# Patient Record
Sex: Female | Born: 1935
Health system: Southern US, Community
[De-identification: ages and names within clinical notes are randomized; demographics above are authoritative.]

## PROBLEM LIST (undated history)

## (undated) DIAGNOSIS — M5430 Sciatica, unspecified side: Secondary | ICD-10-CM

## (undated) DIAGNOSIS — S8010XA Contusion of unspecified lower leg, initial encounter: Secondary | ICD-10-CM

## (undated) HISTORY — DX: Sciatica, unspecified side: M54.30

## (undated) HISTORY — DX: Contusion of unspecified lower leg, initial encounter: S80.10XA

## (undated) HISTORY — PX: VEIN SURGERY: SHX48

---

## 1997-08-24 ENCOUNTER — Ambulatory Visit (HOSPITAL_COMMUNITY): Admission: RE | Admit: 1997-08-24 | Discharge: 1997-08-24 | Payer: Self-pay | Admitting: Obstetrics and Gynecology

## 1998-03-21 ENCOUNTER — Ambulatory Visit (HOSPITAL_COMMUNITY): Admission: RE | Admit: 1998-03-21 | Discharge: 1998-03-21 | Payer: Self-pay | Admitting: Obstetrics & Gynecology

## 1998-03-28 ENCOUNTER — Other Ambulatory Visit: Admission: RE | Admit: 1998-03-28 | Discharge: 1998-03-28 | Payer: Self-pay | Admitting: Obstetrics and Gynecology

## 1999-07-19 ENCOUNTER — Other Ambulatory Visit: Admission: RE | Admit: 1999-07-19 | Discharge: 1999-07-19 | Payer: Self-pay | Admitting: Obstetrics and Gynecology

## 2000-09-11 ENCOUNTER — Other Ambulatory Visit: Admission: RE | Admit: 2000-09-11 | Discharge: 2000-09-11 | Payer: Self-pay | Admitting: Obstetrics and Gynecology

## 2005-09-11 ENCOUNTER — Encounter: Payer: Self-pay | Admitting: Family Medicine

## 2007-07-10 LAB — HM MAMMOGRAPHY: HM Mammogram: NORMAL

## 2007-07-10 LAB — HM COLONOSCOPY: HM Colonoscopy: NORMAL

## 2008-03-11 ENCOUNTER — Encounter: Payer: Self-pay | Admitting: Family Medicine

## 2008-03-23 ENCOUNTER — Encounter: Payer: Self-pay | Admitting: Family Medicine

## 2008-03-23 ENCOUNTER — Encounter: Admission: RE | Admit: 2008-03-23 | Discharge: 2008-03-23 | Payer: Self-pay | Admitting: Family Medicine

## 2008-09-06 ENCOUNTER — Ambulatory Visit: Payer: Self-pay | Admitting: Family Medicine

## 2008-09-06 DIAGNOSIS — R05 Cough: Secondary | ICD-10-CM | POA: Insufficient documentation

## 2008-09-06 DIAGNOSIS — R059 Cough, unspecified: Secondary | ICD-10-CM | POA: Insufficient documentation

## 2008-09-15 ENCOUNTER — Telehealth: Payer: Self-pay | Admitting: Family Medicine

## 2008-09-16 ENCOUNTER — Ambulatory Visit: Payer: Self-pay | Admitting: Family Medicine

## 2008-10-05 ENCOUNTER — Ambulatory Visit: Payer: Self-pay | Admitting: Family Medicine

## 2008-10-05 DIAGNOSIS — M5430 Sciatica, unspecified side: Secondary | ICD-10-CM | POA: Insufficient documentation

## 2008-10-05 HISTORY — DX: Sciatica, unspecified side: M54.30

## 2009-01-07 ENCOUNTER — Telehealth (INDEPENDENT_AMBULATORY_CARE_PROVIDER_SITE_OTHER): Payer: Self-pay | Admitting: *Deleted

## 2009-12-24 IMAGING — CR DG CHEST 2V
2 series · 2 of 2 positions shown · non-contrast
Comparison: 09/06/2008

CLINICAL DATA: Follow-up pneumonia/still some residual chest pain

CHEST - 2 VIEW

[view not recorded (1 of 2)]
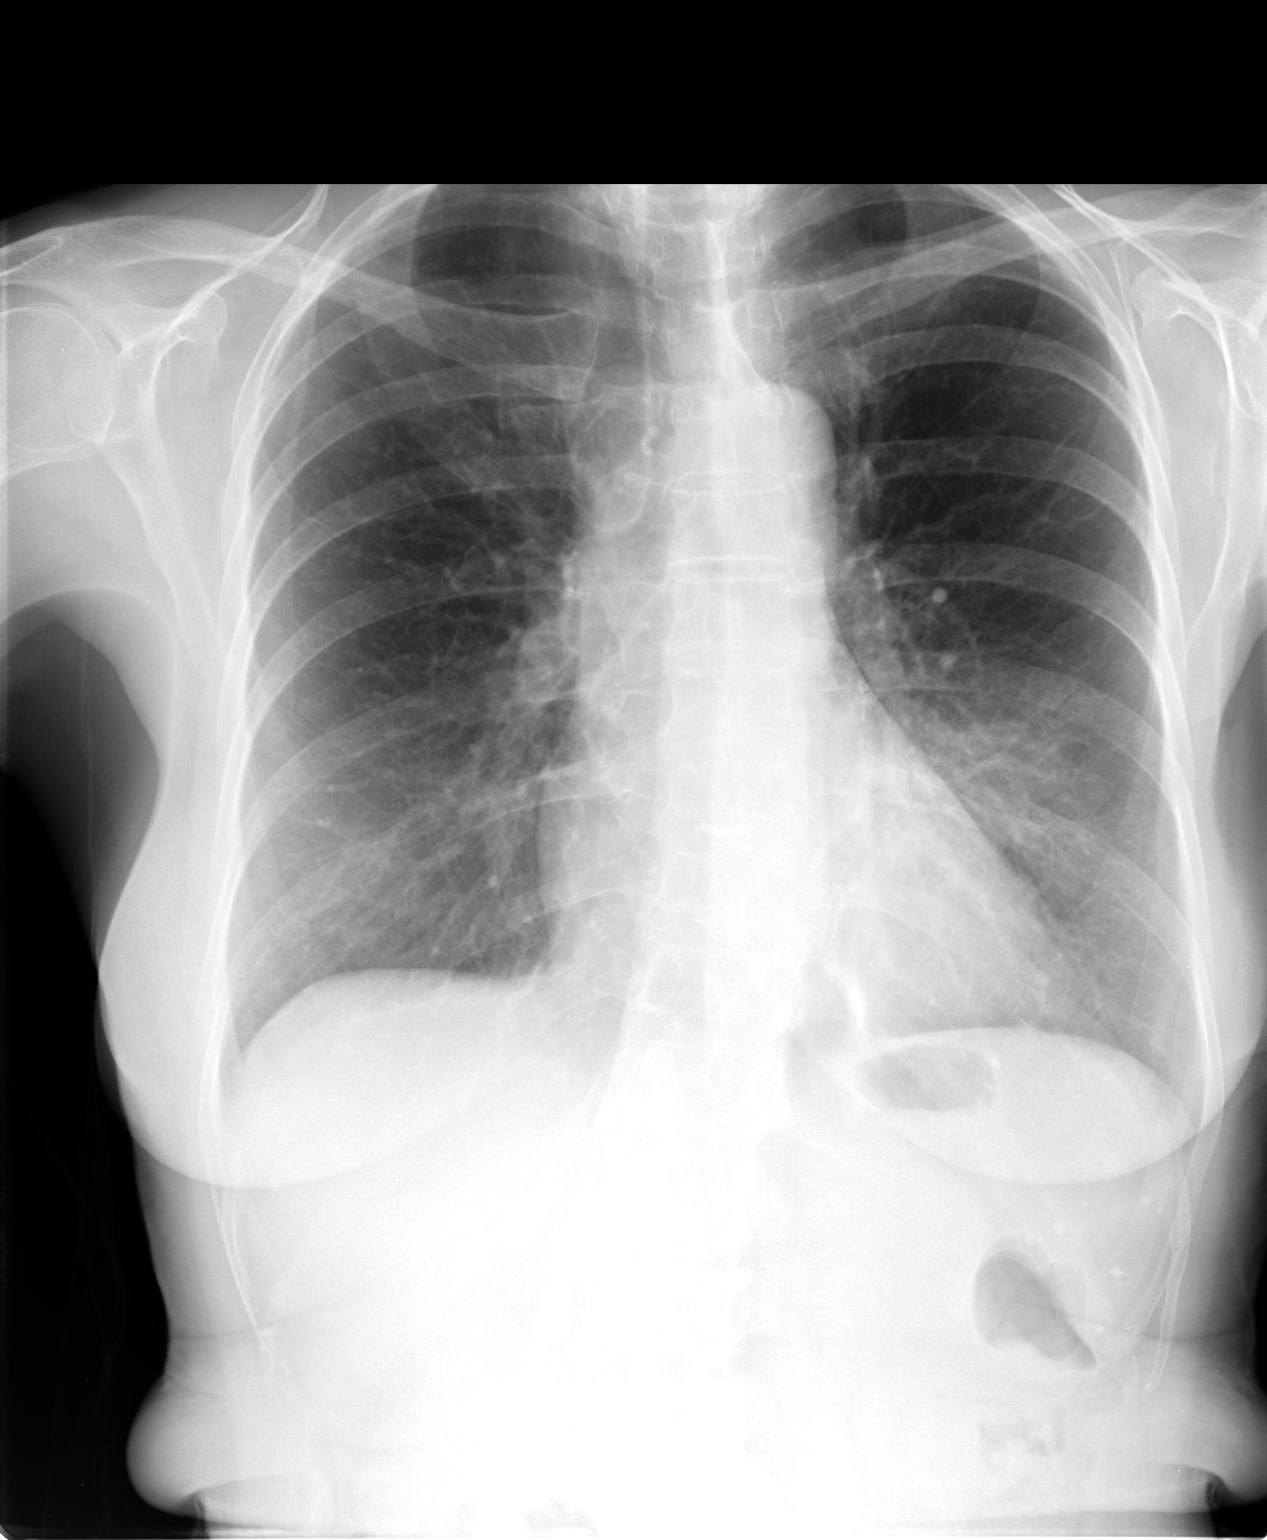

[view not recorded (2 of 2)]
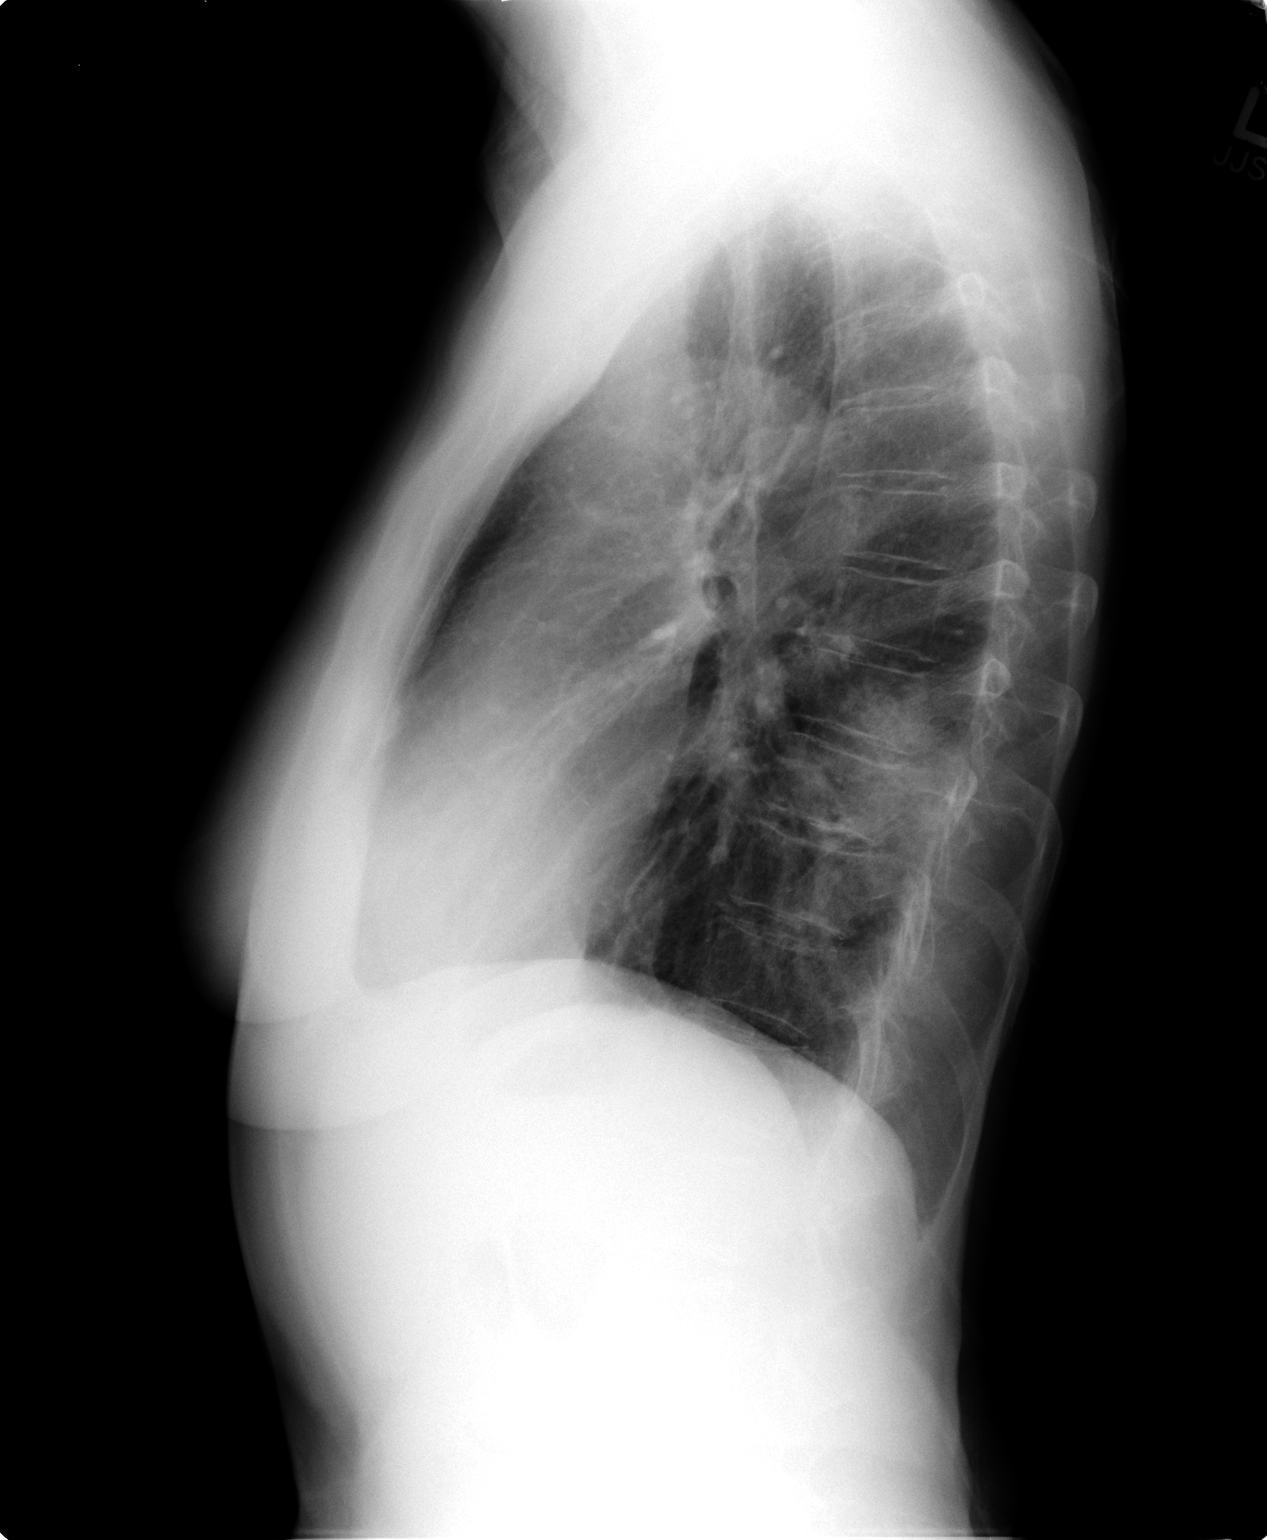

[2 of 2 positions shown; findings below may reference images not displayed]

FINDINGS: Significant interval improvement in the superior segment
left lower lobe airspace density.  The findings are consistent with
resolving pneumonia.  Continued follow-up to complete resolution is
recommended.

Lungs otherwise clear.  No pleural fluid.
IMPRESSION: Significant interval improvement in left lower lobe pneumonia - see
report.  No new findings.

## 2010-01-30 ENCOUNTER — Ambulatory Visit: Payer: Self-pay | Admitting: Family Medicine

## 2010-01-30 DIAGNOSIS — S8010XA Contusion of unspecified lower leg, initial encounter: Secondary | ICD-10-CM

## 2010-01-30 DIAGNOSIS — M5412 Radiculopathy, cervical region: Secondary | ICD-10-CM | POA: Insufficient documentation

## 2010-01-30 HISTORY — DX: Contusion of unspecified lower leg, initial encounter: S80.10XA

## 2010-06-26 ENCOUNTER — Ambulatory Visit
Admission: RE | Admit: 2010-06-26 | Discharge: 2010-06-26 | Payer: Self-pay | Source: Home / Self Care | Attending: Family Medicine | Admitting: Family Medicine

## 2010-06-29 ENCOUNTER — Ambulatory Visit
Admission: RE | Admit: 2010-06-29 | Discharge: 2010-06-29 | Payer: Self-pay | Source: Home / Self Care | Attending: Family Medicine | Admitting: Family Medicine

## 2010-07-20 NOTE — Assessment & Plan Note (Signed)
Summary: emp/pt coming fasting/cjr   Vital Signs:  Patient profile:   77 year old female Menstrual status:  postmenopausal Height:      64.25 inches Weight:      120 pounds BMI:     20.51 Temp:     98.0 degrees F oral Pulse rate:   72 / minute Pulse rhythm:   regular Resp:     12 per minute BP sitting:   130 / 72  (left arm) Cuff size:   regular  Vitals Entered By: Sid Falcon LPN (June 26, 2010 9:23 AM)  History of Present Illness: Here for wellness exam.  Here for Medicare AWV:  1.   Risk factors based on Past M, S, F history has no chronic med problems.  retired Engineer, civil (consulting). 2.   Physical Activities: Exercising regularly with walking 3.   Depression/mood: no depression or anxiety issues. 4.   Hearing: no problems 5.   ADL's: Fully independent 6.   Fall Risk: very low.  Walks regularly. 7.   Home Safety: no concerns identified 8.   Height, weight, &visual acuity:  vision check per optometrist once yearly. wt and height stable. 9.   Counseling: Cont with exercise, Ca and Vit d supplement. 10.   Labs ordered based on risk factors: discussed with pt and non indicated. 11.           Referral Coordination  none indicated at this time. 12.           Care Plan  Flu vaccine, PVX, mammogram Discussed DEXA and pt not interested. 13.            Cognitive Assessment  no defecits in short or long term memory.  No impairment in  judgement.   Clinical Review Panels:  Prevention   Last Mammogram:  normal (06/19/2007)   Last Colonoscopy:  normal (06/19/2007)  Immunizations   Last Tetanus Booster:  Td (06/18/2002)   Last Pneumovax:  Pneumovax (06/26/2010)   Allergies: 1)  ! Sulfadiazine (Sulfadiazine)  Past History:  Past Medical History: Last updated: 09/06/2008 no significant chronic illness.  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[employer]) Married Never Smoked  Risk Factors: Smoking Status: never (09/06/2008) PMH-FH-SH reviewed for relevance  Review of Systems  The patient denies anorexia, fever, weight loss, weight gain, vision loss, decreased hearing, hoarseness, chest pain, syncope, dyspnea on exertion, peripheral edema, prolonged cough, headaches, hemoptysis, abdominal pain, melena, hematochezia, severe indigestion/heartburn, hematuria, incontinence, genital sores, muscle weakness, suspicious skin lesions, transient blindness, difficulty walking, depression, unusual weight change, abnormal bleeding, enlarged lymph nodes, and breast masses.    Physical Exam  General:  Well-developed,well-nourished,in no acute distress; alert,appropriate and cooperative throughout examination Head:  Normocephalic and atraumatic without obvious abnormalities. No apparent alopecia or balding. Eyes:  pupils equal, pupils round, and pupils reactive to light.   Ears:  External ear exam shows no significant lesions or deformities.  Otoscopic examination reveals clear canals, tympanic membranes are intact bilaterally without bulging, retraction, inflammation or discharge. Hearing is grossly normal bilaterally. Mouth:  Oral mucosa and oropharynx without lesions or exudates.  Teeth in good repair. Neck:  No deformities, masses, or tenderness noted. Breasts:  No mass, nodules, thickening, tenderness, bulging, retraction, inflamation, nipple discharge or skin changes noted.   Lungs:  Normal respiratory effort, chest expands  symmetrically. Lungs are clear to auscultation, no crackles or wheezes. Heart:  normal rate and regular rhythm.   Abdomen:  Bowel sounds positive,abdomen soft and non-tender without masses, organomegaly or hernias noted. Extremities:  No clubbing, cyanosis, edema, or deformity noted with normal full range of motion of all joints.    Neurologic:  alert & oriented X3 and cranial nerves II-XII intact.   Skin:  multiple seb keratoses.  No concerning lesions. Cervical Nodes:  No lymphadenopathy noted Psych:  normally interactive, good eye contact, not anxious appearing, and not depressed appearing.     Impression & Recommendations:  Problem # 1:  Preventive Health Care (ICD-V70.0) PVX given.  Rec yearly mammogram.  She has decided against further paps at her age. Flu vaccine needed but we are out.  discussed Zostavax but she is not interested.  Other Orders: Pneumococcal Vaccine (27253) Admin 1st Vaccine (66440) Medicare -1st Annual Wellness Visit [id number])   Orders Added: 1)  Pneumococcal Vaccine [90732] 2)  Admin 1st Vaccine [90471] 3)  Medicare -1st Annual Wellness Visit [G0438]   Immunizations Administered:  Pneumonia Vaccine:    Vaccine Type: Pneumovax    Site: left deltoid    Mfr: Merck    Dose: 0.5 ml    Route: IM    Given by: Sid Falcon LPN    Exp. Date: 10/17/2011    Lot #: [id number]   Immunizations Administered:  Pneumonia Vaccine:    Vaccine Type: Pneumovax    Site: left deltoid    Mfr: Merck    Dose: 0.5 ml    Route: IM    Given by: Sid Falcon LPN    Exp. Date: 10/17/2011    Lot #: [id number]

## 2010-07-20 NOTE — Assessment & Plan Note (Signed)
Summary: flu shot/nn  Nurse Visit   Allergies: 1)  ! Sulfadiazine (Sulfadiazine)  Immunizations Administered:  Influenza Vaccine # 1:    Vaccine Type: Fluvax 3+    Site: left deltoid    Mfr: GlaxoSmithKline    Dose: 0.5 ml    Route: IM    Given by: Sid Falcon LPN    Exp. Date: 11/17/2010    Lot #: [id number]  Flu Vaccine Consent Questions:    Do you have a history of severe allergic reactions to this vaccine? no    Any prior history of allergic reactions to egg and/or gelatin? no    Do you have a sensitivity to the preservative Thimersol? no    Do you have a past history of Guillan-Barre Syndrome? no    Do you currently have an acute febrile illness? no    Have you ever had a severe reaction to latex? no    Vaccine information given and explained to patient? yes    Are you currently pregnant? no  Orders Added: 1)  Flu Vaccine 70yrs + [90658] 2)  Admin 1st Vaccine [10272]

## 2010-07-20 NOTE — Assessment & Plan Note (Signed)
Summary: painful arm/dm   Vital Signs:  Patient profile:   77 year old female Temp:     98.0 degrees F oral BP sitting:   110 / 74  (left arm) Cuff size:   regular  Vitals Entered By: Sid Falcon LPN (January 30, 2010 4:00 PM)  History of Present Illness: [place] Friday night. Standing on child's chair to put away some glasses and lost balance and fell. Bruising to L leg and L 5th phalanx.  Occ fleeting pain down LUE. Has some poorly localized pain L axilla which is actually better today.  No weakness.  Occ tingling sensation L hand but loss of grip. No head injury.  Allergies: 1)  ! Sulfadiazine (Sulfadiazine)  Past History:  Past Medical History: Last updated: 09/06/2008 no significant chronic illness.  Physical Exam  General:  Well-developed,well-nourished,in no acute distress; alert,appropriate and cooperative throughout examination Head:  Normocephalic and atraumatic without obvious abnormalities. No apparent alopecia or balding. Eyes:  pupils equal, pupils round, and pupils reactive to light.   Ears:  External ear exam shows no significant lesions or deformities.  Otoscopic examination reveals clear canals, tympanic membranes are intact bilaterally without bulging, retraction, inflammation or discharge. Hearing is grossly normal bilaterally. Neck:  No deformities, masses, or tenderness noted. Lungs:  Normal respiratory effort, chest expands symmetrically. Lungs are clear to auscultation, no crackles or wheezes. Heart:  normal rate and regular rhythm.   Extremities:  L 5th phalanx mild ecchymosis but nontender and full ROM.  L leg ecchymosis but nontender.  L shoulder full ROM.  No clavicle tenderness.  Neurologic:  full strength upper extremities. sensation intact to light touch and DTRs symmetrical and normal.     Impression & Recommendations:  Problem # 1:  BRACHIAL NEURITIS OR RADICULITIS NOS (ICD-723.4) symptoms are mild and improving and would observe for  now.  Problem # 2:  CONTUSION, LEG (ICD-924.5) reassurance.

## 2010-10-12 ENCOUNTER — Telehealth: Payer: Self-pay | Admitting: *Deleted

## 2010-10-12 NOTE — Telephone Encounter (Signed)
[place]  Took it off, and had a small ring the size of a dime around it.  No other symptoms.  Tick bite was about a month ago, but is almost completely resolved now.

## 2010-10-12 NOTE — Telephone Encounter (Signed)
If rash was only dime size and fully gone now observe.  If she has any recurrent rash, fever, etc needs to be seen.

## 2010-10-12 NOTE — Telephone Encounter (Signed)
Notified pt. 

## 2011-05-01 ENCOUNTER — Other Ambulatory Visit: Payer: Self-pay | Admitting: Family Medicine

## 2011-05-01 DIAGNOSIS — Z1231 Encounter for screening mammogram for malignant neoplasm of breast: Secondary | ICD-10-CM

## 2011-05-24 ENCOUNTER — Ambulatory Visit
Admission: RE | Admit: 2011-05-24 | Discharge: 2011-05-24 | Disposition: A | Discharge status: Home / Self Care | Payer: Medicare Other | Source: Ambulatory Visit | Attending: Family Medicine | Admitting: Family Medicine

## 2011-05-24 DIAGNOSIS — Z1231 Encounter for screening mammogram for malignant neoplasm of breast: Secondary | ICD-10-CM

## 2011-07-10 ENCOUNTER — Encounter: Payer: Self-pay | Admitting: Family Medicine

## 2011-07-11 ENCOUNTER — Encounter: Payer: Self-pay | Admitting: Family Medicine

## 2011-07-11 ENCOUNTER — Ambulatory Visit (INDEPENDENT_AMBULATORY_CARE_PROVIDER_SITE_OTHER): Payer: Medicare Other | Admitting: Family Medicine

## 2011-07-11 VITALS — BP 140/74 | HR 72 | Temp 97.9°F | Resp 12 | Ht 64.0 in | Wt 121.0 lb

## 2011-07-11 DIAGNOSIS — Z23 Encounter for immunization: Secondary | ICD-10-CM

## 2011-07-11 DIAGNOSIS — E7800 Pure hypercholesterolemia, unspecified: Secondary | ICD-10-CM

## 2011-07-11 DIAGNOSIS — Z0000 Encounter for general adult medical examination without abnormal findings: Secondary | ICD-10-CM

## 2011-07-11 LAB — CBC WITH DIFFERENTIAL/PLATELET
Basophils Relative: 0.9 % (ref 0.0–3.0)
Eosinophils Relative: 2 % (ref 0.0–5.0)
Lymphocytes Relative: 34.3 % (ref 12.0–46.0)
MCHC: 33.7 g/dL (ref 30.0–36.0)
MCV: 86.5 fl (ref 78.0–100.0)
Monocytes Absolute: 0.4 10*3/uL (ref 0.1–1.0)
Platelets: 234 10*3/uL (ref 150.0–400.0)
RDW: 13.7 % (ref 11.5–14.6)

## 2011-07-11 LAB — LIPID PANEL
Cholesterol: 249 mg/dL — ABNORMAL HIGH (ref 0–200)
Total CHOL/HDL Ratio: 3
VLDL: 10.4 mg/dL (ref 0.0–40.0)

## 2011-07-11 LAB — BASIC METABOLIC PANEL
Calcium: 9.3 mg/dL (ref 8.4–10.5)
Chloride: 109 mEq/L (ref 96–112)
Creatinine, Ser: 0.7 mg/dL (ref 0.4–1.2)
GFR: 89.62 mL/min (ref >60.00)
Potassium: 4.6 mEq/L (ref 3.5–5.1)

## 2011-07-11 MED ORDER — TETANUS-DIPHTH-ACELL PERTUSSIS 5-2.5-18.5 LF-MCG/0.5 IM SUSP: 0.5000 mL | Freq: Once | INTRAMUSCULAR | Status: DC

## 2011-07-11 NOTE — Patient Instructions (Signed)
Check on insurance coverage for shingles vaccine (Zostavax)

## 2011-07-11 NOTE — Progress Notes (Signed)
  Subjective:    Patient ID: Christina Oliver, female    DOB: 18-Mar-1934, 78 y.o.   MRN: [id number]  HPI  78 year old female who has excellent general health was here for physical. She walks for exercise. No history of smoking. Takes no medications. Last tetanus 2004. Pneumovax 2012. Received yearly flu vaccine. Colonoscopy 2009. Recent mammogram normal. Takes calcium and vitamin D. No history of DEXA scanning but she states she would not undergo any treatment even if this were low. No history of shingles vaccine. History of reported mild hyperlipidemia but excellent HDL.  No history of CAD or PVD and low risk.  Past Medical History  Diagnosis Date  . SCIATICA, ACUTE 10/05/2008  . CONTUSION, LEG 01/30/2010   Past Surgical History  Procedure Date  . Vein surgery     varicose, right leg    reports that she has never smoked. She does not have any smokeless tobacco history on file. Her alcohol and drug histories not on file. family history includes Cancer in her son. Allergies  Allergen Reactions  . Sulfadiazine       Review of Systems  Constitutional: Negative for fever, activity change, appetite change, fatigue and unexpected weight change.  HENT: Negative for hearing loss, ear pain, sore throat and trouble swallowing.   Eyes: Negative for visual disturbance.  Respiratory: Negative for cough and shortness of breath.   Cardiovascular: Negative for chest pain and palpitations.  Gastrointestinal: Negative for abdominal pain, diarrhea, constipation and blood in stool.  Genitourinary: Negative for dysuria and hematuria.  Musculoskeletal: Negative for myalgias, back pain and arthralgias.  Skin: Negative for rash.  Neurological: Negative for dizziness, syncope and headaches.  Hematological: Negative for adenopathy.  Psychiatric/Behavioral: Negative for confusion and dysphoric mood.       Objective:   Physical Exam  Constitutional: She is oriented to person, place, and time. She appears  well-developed and well-nourished.  HENT:  Head: Normocephalic and atraumatic.  Eyes: EOM are normal. Pupils are equal, round, and reactive to light.  Neck: Normal range of motion. Neck supple. No thyromegaly present.  Cardiovascular: Normal rate, regular rhythm and normal heart sounds.   No murmur heard. Pulmonary/Chest: Breath sounds normal. No respiratory distress. She has no wheezes. She has no rales.  Abdominal: Soft. Bowel sounds are normal. She exhibits no distension and no mass. There is no tenderness. There is no rebound and no guarding.  Genitourinary:       Breasts are symmetric with no mass. No nipple inversion. No nipple discharge.  Musculoskeletal: Normal range of motion. She exhibits no edema.  Lymphadenopathy:    She has no cervical adenopathy.  Neurological: She is alert and oriented to person, place, and time. She displays normal reflexes. No cranial nerve deficit.  Skin: No rash noted.  Psychiatric: She has a normal mood and affect. Her behavior is normal. Judgment and thought content normal.          Assessment & Plan:  Healthy 78 year old female. Continue yearly mammogram. Tetanus booster given. She will check on coverage for shingles vaccine. Discussed things like bone density scanning and she refuses. Check lipids, CBC, and basic metabolic panel.

## 2011-07-12 NOTE — Progress Notes (Signed)
Quick Note:  Pt husband informed, mailed copy to pt home ______

## 2012-07-11 ENCOUNTER — Encounter: Payer: Self-pay | Admitting: Family Medicine

## 2012-07-11 ENCOUNTER — Ambulatory Visit (INDEPENDENT_AMBULATORY_CARE_PROVIDER_SITE_OTHER): Payer: Medicare Other | Admitting: Family Medicine

## 2012-07-11 VITALS — BP 110/72 | HR 72 | Temp 97.3°F | Resp 12 | Ht 63.75 in | Wt 122.0 lb

## 2012-07-11 DIAGNOSIS — Z0000 Encounter for general adult medical examination without abnormal findings: Secondary | ICD-10-CM

## 2012-07-11 NOTE — Patient Instructions (Addendum)

## 2012-07-11 NOTE — Progress Notes (Signed)
Subjective:    Patient ID: Christina Oliver, female    DOB: [birth year], 79 y.o.   MRN: [id number]  HPI Patient here for complete physical. She has no major chronic medical problems. Takes no regular medications. She plans to set up repeat mammogram soon. Colonoscopy up to date. She declines shingles vaccine. Tetanus and Pneumovax up-to-date.  She has history of hyperlipidemia but excellent HDL and overall low risk for CAD and peripheral vascular disease. She has declined statin use in the past and does not be appear to be significant candidate anyway.  She walks for exercise.  Had some recent progressive arthritis issues right middle finger DIP joint. Not limiting activities. She has frequent insomnia issues. Minimal alcohol consumption. No late day caffeine use. No daytime napping. She's trying herbal supplements.  Past Medical History  Diagnosis Date  . SCIATICA, ACUTE 10/05/2008  . CONTUSION, LEG 01/30/2010   Past Surgical History  Procedure Date  . Vein surgery     varicose, right leg    reports that she has never smoked. She does not have any smokeless tobacco history on file. Her alcohol and drug histories not on file. family history includes Cancer in her son. Allergies  Allergen Reactions  . Sulfadiazine     As a child      Review of Systems  Constitutional: Negative for fever, activity change, appetite change, fatigue and unexpected weight change.  HENT: Negative for hearing loss, ear pain, sore throat and trouble swallowing.   Eyes: Negative for visual disturbance.  Respiratory: Negative for cough and shortness of breath.   Cardiovascular: Negative for chest pain and palpitations.  Gastrointestinal: Negative for abdominal pain, diarrhea, constipation and blood in stool.  Genitourinary: Negative for dysuria and hematuria.  Musculoskeletal: Positive for arthralgias (hands). Negative for myalgias and back pain.  Skin: Negative for rash.  Neurological: Negative for  dizziness, syncope and headaches.  Hematological: Negative for adenopathy.  Psychiatric/Behavioral: Positive for sleep disturbance. Negative for confusion and dysphoric mood. The patient is not nervous/anxious.        Objective:   Physical Exam  Constitutional: She is oriented to person, place, and time. She appears well-developed and well-nourished.  HENT:  Head: Normocephalic and atraumatic.  Eyes: EOM are normal. Pupils are equal, round, and reactive to light.  Neck: Normal range of motion. Neck supple. No thyromegaly present.  Cardiovascular: Normal rate, regular rhythm and normal heart sounds.   No murmur heard. Pulmonary/Chest: Breath sounds normal. No respiratory distress. She has no wheezes. She has no rales.  Abdominal: Soft. Bowel sounds are normal. She exhibits no distension and no mass. There is no tenderness. There is no rebound and no guarding.  Genitourinary:       Breasts are symmetric with no mass  Musculoskeletal: Normal range of motion. She exhibits no edema.  Lymphadenopathy:    She has no cervical adenopathy.  Neurological: She is alert and oriented to person, place, and time. She displays normal reflexes. No cranial nerve deficit.  Skin: No rash noted.  Psychiatric: She has a normal mood and affect. Her behavior is normal. Judgment and thought content normal.          Assessment & Plan:  Health maintenance. We decided against any lab work including lipids since she's not willing to consider statins and appears to be low risk and history of excellent HDL. She will set up repeat mammogram. She declines shingles vaccine. Colonoscopy up to date  Insomnia discussed. Handout given. Avoid regular use  of sedative hypnotics

## 2012-07-23 ENCOUNTER — Other Ambulatory Visit: Payer: Self-pay | Admitting: Family Medicine

## 2012-07-23 DIAGNOSIS — Z1231 Encounter for screening mammogram for malignant neoplasm of breast: Secondary | ICD-10-CM

## 2012-08-01 ENCOUNTER — Ambulatory Visit (HOSPITAL_COMMUNITY): Payer: Medicare Other

## 2012-08-29 ENCOUNTER — Ambulatory Visit: Payer: Medicare Other

## 2013-01-06 ENCOUNTER — Encounter: Payer: Self-pay | Admitting: Family Medicine

## 2013-01-06 ENCOUNTER — Ambulatory Visit (INDEPENDENT_AMBULATORY_CARE_PROVIDER_SITE_OTHER): Payer: Medicare Other | Admitting: Family Medicine

## 2013-01-06 VITALS — BP 124/60 | HR 74 | Temp 98.3°F | Wt 132.0 lb

## 2013-01-06 DIAGNOSIS — M19049 Primary osteoarthritis, unspecified hand: Secondary | ICD-10-CM

## 2013-01-06 DIAGNOSIS — W57XXXA Bitten or stung by nonvenomous insect and other nonvenomous arthropods, initial encounter: Secondary | ICD-10-CM

## 2013-01-06 DIAGNOSIS — G4700 Insomnia, unspecified: Secondary | ICD-10-CM

## 2013-01-06 NOTE — Patient Instructions (Signed)
Insomnia Insomnia is frequent trouble falling and/or staying asleep. Insomnia can be a long term problem or a short term problem. Both are common. Insomnia can be a short term problem when the wakefulness is related to a certain stress or worry. Long term insomnia is often related to ongoing stress during waking hours and/or poor sleeping habits. Overtime, sleep deprivation itself can make the problem worse. Every little thing feels more severe because you are overtired and your ability to cope is decreased. CAUSES   Stress, anxiety, and depression.  Poor sleeping habits.  Distractions such as TV in the bedroom.  Naps close to bedtime.  Engaging in emotionally charged conversations before bed.  Technical reading before sleep.  Alcohol and other sedatives. They may make the problem worse. They can hurt normal sleep patterns and normal dream activity.  Stimulants such as caffeine for several hours prior to bedtime.  Pain syndromes and shortness of breath can cause insomnia.  Exercise late at night.  Changing time zones may cause sleeping problems (jet lag). It is sometimes helpful to have someone observe your sleeping patterns. They should look for periods of not breathing during the night (sleep apnea). They should also look to see how long those periods last. If you live alone or observers are uncertain, you can also be observed at a sleep clinic where your sleep patterns will be professionally monitored. Sleep apnea requires a checkup and treatment. Give your caregivers your medical history. Give your caregivers observations your family has made about your sleep.  SYMPTOMS   Not feeling rested in the morning.  Anxiety and restlessness at bedtime.  Difficulty falling and staying asleep. TREATMENT   Your caregiver may prescribe treatment for an underlying medical disorders. Your caregiver can give advice or help if you are using alcohol or other drugs for self-medication. Treatment  of underlying problems will usually eliminate insomnia problems.  Medications can be prescribed for short time use. They are generally not recommended for lengthy use.  Over-the-counter sleep medicines are not recommended for lengthy use. They can be habit forming.  You can promote easier sleeping by making lifestyle changes such as:  Using relaxation techniques that help with breathing and reduce muscle tension.  Exercising earlier in the day.  Changing your diet and the time of your last meal. No night time snacks.  Establish a regular time to go to bed.  Counseling can help with stressful problems and worry.  Soothing music and white noise may be helpful if there are background noises you cannot remove.  Stop tedious detailed work at least one hour before bedtime. HOME CARE INSTRUCTIONS   Keep a diary. Inform your caregiver about your progress. This includes any medication side effects. See your caregiver regularly. Take note of:  Times when you are asleep.  Times when you are awake during the night.  The quality of your sleep.  How you feel the next day. This information will help your caregiver care for you.  Get out of bed if you are still awake after 15 minutes. Read or do some quiet activity. Keep the lights down. Wait until you feel sleepy and go back to bed.  Keep regular sleeping and waking hours. Avoid naps.  Exercise regularly.  Avoid distractions at bedtime. Distractions include watching television or engaging in any intense or detailed activity like attempting to balance the household checkbook.  Develop a bedtime ritual. Keep a familiar routine of bathing, brushing your teeth, climbing into bed at the same   time each night, listening to soothing music. Routines increase the success of falling to sleep faster.  Use relaxation techniques. This can be using breathing and muscle tension release routines. It can also include visualizing peaceful scenes. You can  also help control troubling or intruding thoughts by keeping your mind occupied with boring or repetitive thoughts like the old concept of counting sheep. You can make it more creative like imagining planting one beautiful flower after another in your backyard garden.  During your day, work to eliminate stress. When this is not possible use some of the previous suggestions to help reduce the anxiety that accompanies stressful situations. MAKE SURE YOU:   Understand these instructions.  Will watch your condition.  Will get help right away if you are not doing well or get worse. Document Released: 06/01/2000 Document Revised: 08/27/2011 Document Reviewed: 07/02/2007 [facility] Patient Information 2014 [place], [place]. Deer Tick Bite Deer ticks are brown arachnids (spider family) that vary in size from as small as the head of a pin to 1/4 inch (1/2 cm) diameter. They thrive in wooded areas. Deer are the preferred host of adult deer ticks. Small rodents are the host of young ticks (nymphs). When a person walks in a field or wooded area, young and adult ticks in the surrounding grass and vegetation can attach themselves to the skin. They can suck blood for hours to days if unnoticed. Ticks are found all over the [place] Some ticks carry a specific bacteria (Borrelia burgdorferi) that causes an infection called Lyme disease. The bacteria is typically passed into a person during the blood sucking process. This happens after the tick has been attached for at least a number of hours. While ticks can be found all over the [place] those carrying the bacteria that causes Lyme disease are most common in [place] and the [place]. Only a small proportion of ticks in these areas carry the Lyme disease bacteria and cause human infections. Ticks usually attach to warm spots on the body, such as the:  Head.  Back.  Neck.  Armpits.  Groin. SYMPTOMS  Most of the time, a deer tick bite will not be felt. You may or  may not see the attached tick. You may notice mild irritation or redness around the bite site. If the deer tick passes the Lyme disease bacteria to a person, a round, red rash may be noticed 2 to 3 days after the bite. The rash may be clear in the middle, like a bull's-eye or target. If not treated, other symptoms may develop several days to weeks after the onset of the rash. These symptoms may include:  New rash lesions.  Fatigue and weakness.  General ill feeling and achiness.  Chills.  Headache and neck pain.  Swollen lymph glands.  Sore muscles and joints. 5 to 15% of untreated people with Lyme disease may develop more severe illnesses after several weeks to months. This may include inflammation of the brain lining (meningitis), nerve palsies, an abnormal heartbeat, or severe muscle and joint pain and inflammation (myositis or arthritis). DIAGNOSIS   Physical exam and medical history.  Viewing the tick if it was saved for confirmation.  Blood tests (to check or confirm the presence of Lyme disease). TREATMENT  Most ticks do not carry disease. If found, an attached tick should be removed using tweezers. Tweezers should be placed under the body of the tick so it is removed by its attachment parts (pincers). If there are signs or symptoms of  being sick, or Lyme disease is confirmed, medicines (antibiotics) that kill germs are usually prescribed. In more severe cases, antibiotics may be given through an intravenous (IV) access. HOME CARE INSTRUCTIONS   Always remove ticks with tweezers. Do not use petroleum jelly or other methods to kill or remove the tick. Slide the tweezers under the body and pull out as much as you can. If you are not sure what it is, save it in a jar and show your caregiver.  Once you remove the tick, the skin will heal on its own. Wash your hands and the affected area with water and soap. You may place a bandage on the affected area.  Take medicine as directed.  You may be advised to take a full course of antibiotics.  Follow up with your caregiver as recommended. FINDING OUT THE RESULTS OF YOUR TEST Not all test results are available during your visit. If your test results are not back during the visit, make an appointment with your caregiver to find out the results. Do not assume everything is normal if you have not heard from your caregiver or the medical facility. It is important for you to follow up on all of your test results. PROGNOSIS  If Lyme disease is confirmed, early treatment with antibiotics is very effective. Following preventive guidelines is important since it is possible to get the disease more than once. PREVENTION   Wear long sleeves and long pants in wooded or grassy areas. Tuck your pants into your socks.  Use an insect repellent while hiking.  Check yourself, your children, and your pets regularly for ticks after playing outside.  Clear piles of leaves or brush from your yard. Ticks might live there. SEEK MEDICAL CARE IF:   You or your child has an oral temperature above 102 F (38.9 C).  You develop a severe headache following the bite.  You feel generally ill.  You notice a rash.  You are having trouble removing the tick.  The bite area has red skin or yellow drainage. SEEK IMMEDIATE MEDICAL CARE IF:   Your face is weak and droopy or you have other neurological symptoms.  You have severe joint pain or weakness. MAKE SURE YOU:   Understand these instructions.  Will watch your condition.  Will get help right away if you are not doing well or get worse. FOR MORE INFORMATION Centers for Disease Control and Prevention: FootballExhibition.com.br American Academy [employer]: www.https://powers.com/ Document Released: 08/29/2009 Document Revised: 08/27/2011 Document Reviewed: 08/29/2009 [facility] Patient Information 2014 [place], [place].

## 2013-01-06 NOTE — Progress Notes (Signed)
  Subjective:    Patient ID: Christina Oliver, female    DOB: 1934/01/01, 79 y.o.   MRN: [id number]  HPI Deer tick bites during the past week. She's had just a couple small local reactions with itching but no headaches, fever, chills or more generalized rash. No evidence to suggest erythema chronicum migrans.  She has some progressive arthritis changes right and left hand especially right hand second and third digits DIP joints. She is reluctant to take nonsteroidals. Has tried some herbal supplements without much improvement  She has some chronic insomnia. Falls asleep and early morning awakening. No major depression issues. No significant caffeine use at night. No leg date use of caffeine. No daytime napping.  Past Medical History  Diagnosis Date  . SCIATICA, ACUTE 10/05/2008  . CONTUSION, LEG 01/30/2010   Past Surgical History  Procedure Laterality Date  . Vein surgery      varicose, right leg    reports that she has never smoked. She does not have any smokeless tobacco history on file. Her alcohol and drug histories are not on file. family history includes Cancer in her son. Allergies  Allergen Reactions  . Sulfadiazine     As a child       Review of Systems  Constitutional: Positive for fatigue. Negative for fever and chills.  Respiratory: Negative for cough and shortness of breath.   Cardiovascular: Negative for chest pain, palpitations and leg swelling.  Musculoskeletal: Positive for arthralgias.  Skin: Positive for rash.  Neurological: Negative for headaches.  Hematological: Negative for adenopathy.       Objective:   Physical Exam  Constitutional: She appears well-developed and well-nourished.  Neck: Neck supple. No thyromegaly present.  Cardiovascular: Normal rate and regular rhythm.  Exam reveals no gallop.   Pulmonary/Chest: Effort normal and breath sounds normal. No respiratory distress. She has no wheezes. She has no rales.  Musculoskeletal: She exhibits  no edema.  Patient has hypertrophic changes consistent with osteoarthritis involving right and left hand second and third digits DIP joint. No warmth or erythema  Skin:  Patient has a couple small circumferential areas of mild erythema about 1 cm diameter that blanch with palpation with punctate erythematous center. No pustules. Nontender.          Assessment & Plan:  #1 chronic insomnia. Sleep hygiene discussed. She is reluctant to take any medications and we agree with this plan and focusing on sleep hygiene first #2 recent deer tick bites. No worrisome signs or symptoms. Handout given. Reassurance. Followup for any generalized rash or unexplained fever or new symptoms #3 osteoarthritis involving hands. She is reluctant to take nonsteroidals. Continue Tylenol as needed

## 2013-01-19 ENCOUNTER — Encounter: Payer: Self-pay | Admitting: Family Medicine

## 2013-04-23 ENCOUNTER — Other Ambulatory Visit: Payer: Self-pay

## 2013-09-17 ENCOUNTER — Telehealth: Payer: Self-pay | Admitting: Family Medicine

## 2013-09-17 NOTE — Telephone Encounter (Signed)
Referral faxed to [facility] @ [phone] for Dr Velta Addison ([facility]).

## 2014-04-02 ENCOUNTER — Other Ambulatory Visit: Payer: Self-pay

## 2014-04-08 ENCOUNTER — Encounter: Payer: Self-pay | Admitting: Family Medicine

## 2014-04-08 ENCOUNTER — Ambulatory Visit (INDEPENDENT_AMBULATORY_CARE_PROVIDER_SITE_OTHER): Payer: Commercial Managed Care - HMO | Admitting: Family Medicine

## 2014-04-08 VITALS — BP 118/70 | HR 74 | Temp 97.6°F | Wt 122.0 lb

## 2014-04-08 DIAGNOSIS — B9789 Other viral agents as the cause of diseases classified elsewhere: Principal | ICD-10-CM

## 2014-04-08 DIAGNOSIS — J069 Acute upper respiratory infection, unspecified: Secondary | ICD-10-CM

## 2014-04-08 DIAGNOSIS — Z23 Encounter for immunization: Secondary | ICD-10-CM

## 2014-04-08 NOTE — Progress Notes (Signed)
   Subjective:    Patient ID: Christina Oliver, female    DOB: 1933/07/23, 80 y.o.   MRN: [id number]  Cough Associated symptoms include postnasal drip and a sore throat. Pertinent negatives include no chest pain, chills, fever or wheezing.   Acute visit. Patient seen with cough and congestion with onset about 4 days ago. She's had some nasal congestion but no fever. She's had some mild body aches. Started with sore throat initially and that has improved. Overall, she is somewhat improved compared yesterday. She is requesting flu vaccine. She's never smoked. Denies any nausea vomiting or diarrhea. She had community acquired pneumonia about 5 years ago but has not any other respiratory problems otherwise.  Past Medical History  Diagnosis Date  . SCIATICA, ACUTE 10/05/2008  . CONTUSION, LEG 01/30/2010   Past Surgical History  Procedure Laterality Date  . Vein surgery      varicose, right leg    reports that she has never smoked. She does not have any smokeless tobacco history on file. Her alcohol and drug histories are not on file. family history includes Cancer in her son. Allergies  Allergen Reactions  . Sulfadiazine     As a child      Review of Systems  Constitutional: Negative for fever and chills.  HENT: Positive for congestion, postnasal drip and sore throat.   Respiratory: Positive for cough. Negative for wheezing.   Cardiovascular: Negative for chest pain.       Objective:   Physical Exam  Constitutional: She appears well-developed and well-nourished.  HENT:  Right Ear: External ear normal.  Left Ear: External ear normal.  Mouth/Throat: Oropharynx is clear and moist.  Cardiovascular: Normal rate and regular rhythm.   Pulmonary/Chest: Effort normal and breath sounds normal. No respiratory distress. She has no wheezes. She has no rales.          Assessment & Plan:  Probable viral URI. Nonfocal exam. Over-the-counter medications as needed. Flu vaccine given.  Patient will schedule complete physical at some point this year

## 2014-04-08 NOTE — Progress Notes (Signed)
Pre visit review using our clinic review tool, if applicable. No additional management support is needed unless otherwise documented below in the visit note. 

## 2014-04-08 NOTE — Patient Instructions (Signed)
Upper Respiratory Infection, Adult An upper respiratory infection (URI) is also known as the common cold. It is often caused by a type of germ (virus). Colds are easily spread (contagious). You can pass it to others by kissing, coughing, sneezing, or drinking out of the same glass. Usually, you get better in 1 or 2 weeks.  HOME CARE   Only take medicine as told by your doctor.  Use a warm mist humidifier or breathe in steam from a hot shower.  Drink enough water and fluids to keep your pee (urine) clear or pale yellow.  Get plenty of rest.  Return to work when your temperature is back to normal or as told by your doctor. You may use a face mask and wash your hands to stop your cold from spreading. GET HELP RIGHT AWAY IF:   After the first few days, you feel you are getting worse.  You have questions about your medicine.  You have chills, shortness of breath, or brown or red spit (mucus).  You have yellow or brown snot (nasal discharge) or pain in the face, especially when you bend forward.  You have a fever, puffy (swollen) neck, pain when you swallow, or white spots in the back of your throat.  You have a bad headache, ear pain, sinus pain, or chest pain.  You have a high-pitched whistling sound when you breathe in and out (wheezing).  You have a lasting cough or cough up blood.  You have sore muscles or a stiff neck. MAKE SURE YOU:   Understand these instructions.  Will watch your condition.  Will get help right away if you are not doing well or get worse. Document Released: 11/21/2007 Document Revised: 08/27/2011 Document Reviewed: 09/09/2013 [employer] Patient Information 2015 [employer]. This information is not intended to replace advice given to you by your health care provider. Make sure you discuss any questions you have with your health care provider.  

## 2014-05-24 ENCOUNTER — Other Ambulatory Visit: Payer: Commercial Managed Care - HMO

## 2014-05-25 ENCOUNTER — Other Ambulatory Visit (INDEPENDENT_AMBULATORY_CARE_PROVIDER_SITE_OTHER): Payer: Commercial Managed Care - HMO

## 2014-05-25 DIAGNOSIS — Z0000 Encounter for general adult medical examination without abnormal findings: Secondary | ICD-10-CM

## 2014-05-25 LAB — COMPREHENSIVE METABOLIC PANEL
ALK PHOS: 58 U/L (ref 39–117)
ALT: 12 U/L (ref 0–35)
AST: 17 U/L (ref 0–37)
Albumin: 3.8 g/dL (ref 3.5–5.2)
BILIRUBIN TOTAL: 0.9 mg/dL (ref 0.2–1.2)
BUN: 19 mg/dL (ref 6–23)
CO2: 26 meq/L (ref 19–32)
Calcium: 8.8 mg/dL (ref 8.4–10.5)
Chloride: 107 mEq/L (ref 96–112)
Creatinine, Ser: 0.7 mg/dL (ref 0.4–1.2)
GFR: 86.01 mL/min (ref >60.00)
Glucose, Bld: 86 mg/dL (ref 70–99)
POTASSIUM: 3.9 meq/L (ref 3.5–5.1)
SODIUM: 139 meq/L (ref 135–145)
Total Protein: 6.2 g/dL (ref 6.0–8.3)

## 2014-05-25 LAB — CBC WITH DIFFERENTIAL/PLATELET
Basophils Absolute: 0 10*3/uL (ref 0.0–0.1)
Basophils Relative: 0.8 % (ref 0.0–3.0)
EOS PCT: 2.4 % (ref 0.0–5.0)
Eosinophils Absolute: 0.1 10*3/uL (ref 0.0–0.7)
HCT: 34.6 % — ABNORMAL LOW (ref 36.0–46.0)
Hemoglobin: 11.3 g/dL — ABNORMAL LOW (ref 12.0–15.0)
LYMPHS PCT: 38.2 % (ref 12.0–46.0)
Lymphs Abs: 1.9 10*3/uL (ref 0.7–4.0)
MCHC: 32.8 g/dL (ref 30.0–36.0)
MCV: 85.7 fl (ref 78.0–100.0)
MONO ABS: 0.4 10*3/uL (ref 0.1–1.0)
MONOS PCT: 6.9 % (ref 3.0–12.0)
NEUTROS ABS: 2.6 10*3/uL (ref 1.4–7.7)
NEUTROS PCT: 51.7 % (ref 43.0–77.0)
PLATELETS: 251 10*3/uL (ref 150.0–400.0)
RBC: 4.03 Mil/uL (ref 3.87–5.11)
RDW: 13.5 % (ref 11.5–15.5)
WBC: 5.1 10*3/uL (ref 4.0–10.5)

## 2014-05-25 LAB — LIPID PANEL
CHOL/HDL RATIO: 4
Cholesterol: 257 mg/dL — ABNORMAL HIGH (ref 0–200)
HDL: 66.6 mg/dL (ref >39.00)
LDL CALC: 179 mg/dL — AB (ref 0–99)
NonHDL: 190.4
TRIGLYCERIDES: 56 mg/dL (ref 0.0–149.0)
VLDL: 11.2 mg/dL (ref 0.0–40.0)

## 2014-05-25 LAB — POCT URINALYSIS DIPSTICK
Bilirubin, UA: NEGATIVE
Blood, UA: NEGATIVE
Glucose, UA: NEGATIVE
KETONES UA: NEGATIVE
Nitrite, UA: NEGATIVE
Protein, UA: NEGATIVE
SPEC GRAV UA: 1.015
Urobilinogen, UA: 0.2
pH, UA: 5.5

## 2014-05-25 LAB — TSH: TSH: 1.14 u[IU]/mL (ref 0.35–4.50)

## 2014-06-02 ENCOUNTER — Ambulatory Visit (INDEPENDENT_AMBULATORY_CARE_PROVIDER_SITE_OTHER): Payer: Commercial Managed Care - HMO | Admitting: Family Medicine

## 2014-06-02 ENCOUNTER — Encounter: Payer: Self-pay | Admitting: Family Medicine

## 2014-06-02 VITALS — BP 124/68 | HR 71 | Temp 97.7°F | Ht 63.75 in | Wt 122.0 lb

## 2014-06-02 DIAGNOSIS — E785 Hyperlipidemia, unspecified: Secondary | ICD-10-CM | POA: Insufficient documentation

## 2014-06-02 DIAGNOSIS — D649 Anemia, unspecified: Secondary | ICD-10-CM

## 2014-06-02 DIAGNOSIS — Z0000 Encounter for general adult medical examination without abnormal findings: Secondary | ICD-10-CM

## 2014-06-02 NOTE — Progress Notes (Signed)
   Subjective:    Patient ID: Christina Oliver, female    DOB: 1933/11/26, 80 y.o.   MRN: [id number]  HPI   Patient here for complete physical. Generally very healthy. She has declined things like mammograms. She had colonoscopy 2009. She has never had shingles vaccine and does not wish to pursue at this time. Nonsmoker. She's been dealing with husband's health issues over the past year.  Past Medical History  Diagnosis Date  . SCIATICA, ACUTE 10/05/2008  . CONTUSION, LEG 01/30/2010   Past Surgical History  Procedure Laterality Date  . Vein surgery      varicose, right leg    reports that she has never smoked. She does not have any smokeless tobacco history on file. Her alcohol and drug histories are not on file. family history includes Cancer in her son. Allergies  Allergen Reactions  . Sulfadiazine     As a child     Review of Systems  Constitutional: Negative for fever, activity change, appetite change, fatigue and unexpected weight change.  HENT: Negative for ear pain, hearing loss, sore throat and trouble swallowing.   Eyes: Negative for visual disturbance.  Respiratory: Negative for cough and shortness of breath.   Cardiovascular: Negative for chest pain and palpitations.  Gastrointestinal: Negative for abdominal pain, diarrhea, constipation and blood in stool.  Genitourinary: Negative for dysuria and hematuria.  Musculoskeletal: Negative for myalgias, back pain and arthralgias.  Skin: Negative for rash.  Neurological: Negative for dizziness, syncope and headaches.  Hematological: Negative for adenopathy.  Psychiatric/Behavioral: Negative for confusion and dysphoric mood.       Objective:   Physical Exam  Constitutional: She is oriented to person, place, and time. She appears well-developed and well-nourished.  HENT:  Head: Normocephalic and atraumatic.  Eyes: EOM are normal. Pupils are equal, round, and reactive to light.  Neck: Normal range of motion. Neck  supple. No thyromegaly present.  Cardiovascular: Normal rate, regular rhythm and normal heart sounds.   No murmur heard. Pulmonary/Chest: Breath sounds normal. No respiratory distress. She has no wheezes. She has no rales.  Abdominal: Soft. Bowel sounds are normal. She exhibits no distension and no mass. There is no tenderness. There is no rebound and no guarding.  Musculoskeletal: Normal range of motion. She exhibits no edema.  Lymphadenopathy:    She has no cervical adenopathy.  Neurological: She is alert and oriented to person, place, and time. She displays normal reflexes. No cranial nerve deficit.  Skin: No rash noted.  Psychiatric: She has a normal mood and affect. Her behavior is normal. Judgment and thought content normal.          Assessment & Plan:  Complete physical. She has elevated lipids but good HDL. We discussed statins and she declines. We have encouraged her to consider mammogram when she gets back from her trip to [place]. Also recommended Prevnar 13 and she will consider shingles vaccine. Follow-up after she gets back for further labs with ferritin, TIBC, B12

## 2014-06-02 NOTE — Progress Notes (Signed)
Pre visit review using our clinic review tool, if applicable. No additional management support is needed unless otherwise documented below in the visit note. 

## 2014-06-02 NOTE — Patient Instructions (Signed)
Fat and Cholesterol Control Diet Fat and cholesterol levels in your blood and organs are influenced by your diet. High levels of fat and cholesterol may lead to diseases of the heart, small and large blood vessels, gallbladder, liver, and pancreas. CONTROLLING FAT AND CHOLESTEROL WITH DIET Although exercise and lifestyle factors are important, your diet is key. That is because certain foods are known to raise cholesterol and others to lower it. The goal is to balance foods for their effect on cholesterol and more importantly, to replace saturated and trans fat with other types of fat, such as monounsaturated fat, polyunsaturated fat, and omega-3 fatty acids. On average, a person should consume no more than 15 to 17 g of saturated fat daily. Saturated and trans fats are considered "bad" fats, and they will raise LDL cholesterol. Saturated fats are primarily found in animal products such as meats, butter, and cream. However, that does not mean you need to give up all your favorite foods. Today, there are good tasting, low-fat, low-cholesterol substitutes for most of the things you like to eat. Choose low-fat or nonfat alternatives. Choose round or loin cuts of red meat. These types of cuts are lowest in fat and cholesterol. Chicken (without the skin), fish, veal, and ground turkey breast are great choices. Eliminate fatty meats, such as hot dogs and salami. Even shellfish have little or no saturated fat. Have a 3 oz (85 g) portion when you eat lean meat, poultry, or fish. Trans fats are also called "partially hydrogenated oils." They are oils that have been scientifically manipulated so that they are solid at room temperature resulting in a longer shelf life and improved taste and texture of foods in which they are added. Trans fats are found in stick margarine, some tub margarines, cookies, crackers, and baked goods.  When baking and cooking, oils are a great substitute for butter. The monounsaturated oils are  especially beneficial since it is believed they lower LDL and raise HDL. The oils you should avoid entirely are saturated tropical oils, such as coconut and palm.  Remember to eat a lot from food groups that are naturally free of saturated and trans fat, including fish, fruit, vegetables, beans, grains (barley, rice, couscous, bulgur wheat), and pasta (without cream sauces).  IDENTIFYING FOODS THAT LOWER FAT AND CHOLESTEROL  Soluble fiber may lower your cholesterol. This type of fiber is found in fruits such as apples, vegetables such as broccoli, potatoes, and carrots, legumes such as beans, peas, and lentils, and grains such as barley. Foods fortified with plant sterols (phytosterol) may also lower cholesterol. You should eat at least 2 g per day of these foods for a cholesterol lowering effect.  Read package labels to identify low-saturated fats, trans fat free, and low-fat foods at the supermarket. Select cheeses that have only 2 to 3 g saturated fat per ounce. Use a heart-healthy tub margarine that is free of trans fats or partially hydrogenated oil. When buying baked goods (cookies, crackers), avoid partially hydrogenated oils. Breads and muffins should be made from whole grains (whole-wheat or whole oat flour, instead of "flour" or "enriched flour"). Buy non-creamy canned soups with reduced salt and no added fats.  FOOD PREPARATION TECHNIQUES  Never deep-fry. If you must fry, either stir-fry, which uses very little fat, or use non-stick cooking sprays. When possible, broil, bake, or roast meats, and steam vegetables. Instead of putting butter or margarine on vegetables, use lemon and herbs, applesauce, and cinnamon (for squash and sweet potatoes). Use nonfat   yogurt, salsa, and low-fat dressings for salads.  LOW-SATURATED FAT / LOW-FAT FOOD SUBSTITUTES Meats / Saturated Fat (g)  Avoid: Steak, marbled (3 oz/85 g) / 11 g  Choose: Steak, lean (3 oz/85 g) / 4 g  Avoid: Hamburger (3 oz/85 g) / 7  g  Choose: Hamburger, lean (3 oz/85 g) / 5 g  Avoid: Ham (3 oz/85 g) / 6 g  Choose: Ham, lean cut (3 oz/85 g) / 2.4 g  Avoid: Chicken, with skin, dark meat (3 oz/85 g) / 4 g  Choose: Chicken, skin removed, dark meat (3 oz/85 g) / 2 g  Avoid: Chicken, with skin, light meat (3 oz/85 g) / 2.5 g  Choose: Chicken, skin removed, light meat (3 oz/85 g) / 1 g Dairy / Saturated Fat (g)  Avoid: Whole milk (1 cup) / 5 g  Choose: Low-fat milk, 2% (1 cup) / 3 g  Choose: Low-fat milk, 1% (1 cup) / 1.5 g  Choose: Skim milk (1 cup) / 0.3 g  Avoid: Hard cheese (1 oz/28 g) / 6 g  Choose: Skim milk cheese (1 oz/28 g) / 2 to 3 g  Avoid: Cottage cheese, 4% fat (1 cup) / 6.5 g  Choose: Low-fat cottage cheese, 1% fat (1 cup) / 1.5 g  Avoid: Ice cream (1 cup) / 9 g  Choose: Sherbet (1 cup) / 2.5 g  Choose: Nonfat frozen yogurt (1 cup) / 0.3 g  Choose: Frozen fruit bar / trace  Avoid: Whipped cream (1 tbs) / 3.5 g  Choose: Nondairy whipped topping (1 tbs) / 1 g Condiments / Saturated Fat (g)  Avoid: Mayonnaise (1 tbs) / 2 g  Choose: Low-fat mayonnaise (1 tbs) / 1 g  Avoid: Butter (1 tbs) / 7 g  Choose: Extra light margarine (1 tbs) / 1 g  Avoid: Coconut oil (1 tbs) / 11.8 g  Choose: Olive oil (1 tbs) / 1.8 g  Choose: Corn oil (1 tbs) / 1.7 g  Choose: Safflower oil (1 tbs) / 1.2 g  Choose: Sunflower oil (1 tbs) / 1.4 g  Choose: Soybean oil (1 tbs) / 2.4 g  Choose: Canola oil (1 tbs) / 1 g Document Released: 06/04/2005 Document Revised: 09/29/2012 Document Reviewed: 09/02/2013 Exit[employer] Patient Information 2015 [place], [place]. This information is not intended to replace advice given to you by your health care provider. Make sure you discuss any questions you have with your health care provider.  Consider mammogram.   Consider shingles vaccine and Prevnar 13 when you get back.

## 2014-09-29 ENCOUNTER — Encounter: Payer: Self-pay | Admitting: Family Medicine

## 2014-10-13 ENCOUNTER — Encounter: Payer: Self-pay | Admitting: Family Medicine

## 2014-10-13 ENCOUNTER — Ambulatory Visit (INDEPENDENT_AMBULATORY_CARE_PROVIDER_SITE_OTHER): Payer: PPO | Admitting: Family Medicine

## 2014-10-13 VITALS — BP 120/70 | HR 71 | Temp 97.4°F | Wt 126.0 lb

## 2014-10-13 DIAGNOSIS — R21 Rash and other nonspecific skin eruption: Secondary | ICD-10-CM

## 2014-10-13 NOTE — Progress Notes (Signed)
   Subjective:    Patient ID: Christina Oliver, female    DOB: [birth year], 81 y.o.   MRN: [id number]  HPI Patient seen with erythematous rash right lateral foot. Present for several weeks. She's had some dryness and scaling. She applied some Neosporin without improvement. She's had some very mild intermittent numbness involving the dorsum of the foot but no weakness. No radiculopathy symptoms. No fevers or chills. No exacerbating or alleviating factors.  Past Medical History  Diagnosis Date  . SCIATICA, ACUTE 10/05/2008  . CONTUSION, LEG 01/30/2010   Past Surgical History  Procedure Laterality Date  . Vein surgery      varicose, right leg    reports that she has never smoked. She does not have any smokeless tobacco history on file. Her alcohol and drug histories are not on file. family history includes Cancer in her son. Allergies  Allergen Reactions  . Sulfadiazine     As a child      Review of Systems  Constitutional: Negative for fever and chills.  Skin: Positive for rash.       Objective:   Physical Exam  Constitutional: She appears well-developed and well-nourished.  Cardiovascular: Normal rate and regular rhythm.   Skin: Rash noted.  She has erythematous rash with scaly surface mostly involving lateral aspect distal right foot and also some between the fourth and fifth toe. She has a couple of superficial vesicles in this region.          Assessment & Plan:  Foot rash. Differential is fungal versus eczema such as dyshidrosis. Foot scraping for KOH. Will base therapy on this. If positive antifungal if negative consider topical corticosteroid.

## 2014-10-13 NOTE — Progress Notes (Signed)
Pre visit review using our clinic review tool, if applicable. No additional management support is needed unless otherwise documented below in the visit note. 

## 2014-10-15 LAB — KOH PREP: RESULT - KOH: NONE SEEN

## 2014-10-19 ENCOUNTER — Other Ambulatory Visit: Payer: Self-pay

## 2014-10-19 MED ORDER — DESOXIMETASONE 0.25 % EX CREA: 1.0000 "application " | TOPICAL_CREAM | Freq: Two times a day (BID) | CUTANEOUS | Status: DC

## 2014-12-03 ENCOUNTER — Encounter: Payer: Self-pay | Admitting: Family Medicine

## 2014-12-13 ENCOUNTER — Other Ambulatory Visit: Payer: Self-pay

## 2015-06-29 ENCOUNTER — Other Ambulatory Visit: Payer: PPO

## 2015-07-04 ENCOUNTER — Encounter: Payer: PPO | Admitting: Family Medicine

## 2015-07-08 ENCOUNTER — Other Ambulatory Visit (INDEPENDENT_AMBULATORY_CARE_PROVIDER_SITE_OTHER): Payer: PPO

## 2015-07-08 DIAGNOSIS — Z0000 Encounter for general adult medical examination without abnormal findings: Secondary | ICD-10-CM

## 2015-07-08 LAB — BASIC METABOLIC PANEL
BUN: 16 mg/dL (ref 6–23)
CALCIUM: 9 mg/dL (ref 8.4–10.5)
CO2: 25 meq/L (ref 19–32)
Chloride: 107 mEq/L (ref 96–112)
Creatinine, Ser: 0.72 mg/dL (ref 0.40–1.20)
GFR: 83.02 mL/min (ref >60.00)
GLUCOSE: 84 mg/dL (ref 70–99)
POTASSIUM: 4.6 meq/L (ref 3.5–5.1)
SODIUM: 139 meq/L (ref 135–145)

## 2015-07-08 LAB — CBC WITH DIFFERENTIAL/PLATELET
BASOS PCT: 0.7 % (ref 0.0–3.0)
Basophils Absolute: 0 10*3/uL (ref 0.0–0.1)
EOS PCT: 2.2 % (ref 0.0–5.0)
Eosinophils Absolute: 0.1 10*3/uL (ref 0.0–0.7)
HEMATOCRIT: 35 % — AB (ref 36.0–46.0)
Hemoglobin: 11.6 g/dL — ABNORMAL LOW (ref 12.0–15.0)
LYMPHS ABS: 1.6 10*3/uL (ref 0.7–4.0)
Lymphocytes Relative: 33.2 % (ref 12.0–46.0)
MCHC: 33 g/dL (ref 30.0–36.0)
MCV: 84.5 fl (ref 78.0–100.0)
MONOS PCT: 8.5 % (ref 3.0–12.0)
Monocytes Absolute: 0.4 10*3/uL (ref 0.1–1.0)
NEUTROS ABS: 2.7 10*3/uL (ref 1.4–7.7)
NEUTROS PCT: 55.4 % (ref 43.0–77.0)
PLATELETS: 240 10*3/uL (ref 150.0–400.0)
RBC: 4.14 Mil/uL (ref 3.87–5.11)
RDW: 13.5 % (ref 11.5–15.5)
WBC: 4.9 10*3/uL (ref 4.0–10.5)

## 2015-07-08 LAB — LIPID PANEL
CHOLESTEROL: 240 mg/dL — AB (ref 0–200)
HDL: 71.4 mg/dL (ref >39.00)
LDL Cholesterol: 158 mg/dL — ABNORMAL HIGH (ref 0–99)
NonHDL: 168.78
Total CHOL/HDL Ratio: 3
Triglycerides: 56 mg/dL (ref 0.0–149.0)
VLDL: 11.2 mg/dL (ref 0.0–40.0)

## 2015-07-08 LAB — TSH: TSH: 1.01 u[IU]/mL (ref 0.35–4.50)

## 2015-07-08 LAB — HEPATIC FUNCTION PANEL
ALBUMIN: 4 g/dL (ref 3.5–5.2)
ALT: 12 U/L (ref 0–35)
AST: 17 U/L (ref 0–37)
Alkaline Phosphatase: 66 U/L (ref 39–117)
Bilirubin, Direct: 0.1 mg/dL (ref 0.0–0.3)
TOTAL PROTEIN: 6.7 g/dL (ref 6.0–8.3)
Total Bilirubin: 0.8 mg/dL (ref 0.2–1.2)

## 2015-07-15 ENCOUNTER — Ambulatory Visit (INDEPENDENT_AMBULATORY_CARE_PROVIDER_SITE_OTHER): Payer: PPO | Admitting: Family Medicine

## 2015-07-15 ENCOUNTER — Encounter: Payer: Self-pay | Admitting: Family Medicine

## 2015-07-15 VITALS — BP 118/60 | HR 81 | Temp 97.9°F | Ht 63.5 in | Wt 125.0 lb

## 2015-07-15 DIAGNOSIS — Z0000 Encounter for general adult medical examination without abnormal findings: Secondary | ICD-10-CM

## 2015-07-15 DIAGNOSIS — Z23 Encounter for immunization: Secondary | ICD-10-CM

## 2015-07-15 NOTE — Patient Instructions (Signed)
Continue with yearly flu vaccine Continue with daily calcium and Vit D

## 2015-07-15 NOTE — Progress Notes (Signed)
Pre visit review using our clinic review tool, if applicable. No additional management support is needed unless otherwise documented below in the visit note. 

## 2015-07-15 NOTE — Progress Notes (Signed)
   Subjective:    Patient ID: Christina Oliver, female    DOB: 1933-07-23, 82 y.o.   MRN: [id number]  HPI Patient here for physical exam. She is generally very healthy but has had the burden of taking care of her husband who has had tremendous health problems and is essentially almost bedbound. She's not been able to exercise much. No recent falls. Denies any depression issues. Takes no medications. She did have a mammogram earlier this year which was normal. She has decided to get every other year mammograms. Colonoscopy up-to-date. Needs Prevnar. Already had flu vaccine.  Patient declines bone density scan. She does take calcium and vitamin D  Past Medical History  Diagnosis Date  . SCIATICA, ACUTE 10/05/2008  . CONTUSION, LEG 01/30/2010   Past Surgical History  Procedure Laterality Date  . Vein surgery      varicose, right leg    reports that she has never smoked. She does not have any smokeless tobacco history on file. Her alcohol and drug histories are not on file. family history includes Cancer in her son. Allergies  Allergen Reactions  . Sulfadiazine     As a child      Review of Systems  Constitutional: Negative for fever, activity change, appetite change, fatigue and unexpected weight change.  HENT: Negative for ear pain, hearing loss, sore throat and trouble swallowing.   Eyes: Negative for visual disturbance.  Respiratory: Negative for cough and shortness of breath.   Cardiovascular: Negative for chest pain and palpitations.  Gastrointestinal: Negative for abdominal pain, diarrhea, constipation and blood in stool.  Genitourinary: Negative for dysuria and hematuria.  Musculoskeletal: Negative for myalgias, back pain and arthralgias.  Skin: Negative for rash.  Neurological: Negative for dizziness, syncope and headaches.  Hematological: Negative for adenopathy.  Psychiatric/Behavioral: Negative for confusion and dysphoric mood.       Objective:   Physical Exam    Constitutional: She is oriented to person, place, and time. She appears well-developed and well-nourished.  HENT:  Head: Normocephalic and atraumatic.  Eyes: EOM are normal. Pupils are equal, round, and reactive to light.  Neck: Normal range of motion. Neck supple. No thyromegaly present.  Cardiovascular: Normal rate, regular rhythm and normal heart sounds.   No murmur heard. Pulmonary/Chest: Breath sounds normal. No respiratory distress. She has no wheezes. She has no rales.  Abdominal: Soft. Bowel sounds are normal. She exhibits no distension and no mass. There is no tenderness. There is no rebound and no guarding.  Musculoskeletal: Normal range of motion. She exhibits no edema.  Lymphadenopathy:    She has no cervical adenopathy.  Neurological: She is alert and oriented to person, place, and time. She displays normal reflexes. No cranial nerve deficit.  Skin: No rash noted.  Psychiatric: She has a normal mood and affect. Her behavior is normal. Judgment and thought content normal.          Assessment & Plan:  Physical exam. Labs reviewed. No major concerns. Prevnar 13 given. Continue yearly flu vaccine. Patient declines DEXA scan. Continue daily calcium and vitamin D Pt not interested in further colonoscopies.

## 2016-02-13 DIAGNOSIS — H2513 Age-related nuclear cataract, bilateral: Secondary | ICD-10-CM | POA: Diagnosis not present

## 2016-07-10 ENCOUNTER — Other Ambulatory Visit (INDEPENDENT_AMBULATORY_CARE_PROVIDER_SITE_OTHER): Payer: PPO

## 2016-07-10 DIAGNOSIS — Z0000 Encounter for general adult medical examination without abnormal findings: Secondary | ICD-10-CM | POA: Diagnosis not present

## 2016-07-10 LAB — HEPATIC FUNCTION PANEL
ALK PHOS: 63 U/L (ref 39–117)
ALT: 9 U/L (ref 0–35)
AST: 15 U/L (ref 0–37)
Albumin: 4.3 g/dL (ref 3.5–5.2)
BILIRUBIN TOTAL: 0.8 mg/dL (ref 0.2–1.2)
Bilirubin, Direct: 0.1 mg/dL (ref 0.0–0.3)
Total Protein: 6.6 g/dL (ref 6.0–8.3)

## 2016-07-10 LAB — BASIC METABOLIC PANEL
BUN: 18 mg/dL (ref 6–23)
CALCIUM: 9.2 mg/dL (ref 8.4–10.5)
CO2: 29 mEq/L (ref 19–32)
Chloride: 107 mEq/L (ref 96–112)
Creatinine, Ser: 0.7 mg/dL (ref 0.40–1.20)
GFR: 85.55 mL/min (ref >60.00)
GLUCOSE: 83 mg/dL (ref 70–99)
Potassium: 4.1 mEq/L (ref 3.5–5.1)
SODIUM: 140 meq/L (ref 135–145)

## 2016-07-10 LAB — CBC WITH DIFFERENTIAL/PLATELET
BASOS ABS: 0 10*3/uL (ref 0.0–0.1)
Basophils Relative: 0.5 % (ref 0.0–3.0)
EOS PCT: 1.9 % (ref 0.0–5.0)
Eosinophils Absolute: 0.1 10*3/uL (ref 0.0–0.7)
HCT: 34.8 % — ABNORMAL LOW (ref 36.0–46.0)
HEMOGLOBIN: 11.6 g/dL — AB (ref 12.0–15.0)
Lymphocytes Relative: 37.6 % (ref 12.0–46.0)
Lymphs Abs: 1.7 10*3/uL (ref 0.7–4.0)
MCHC: 33.3 g/dL (ref 30.0–36.0)
MCV: 85.5 fl (ref 78.0–100.0)
MONO ABS: 0.3 10*3/uL (ref 0.1–1.0)
MONOS PCT: 6.9 % (ref 3.0–12.0)
NEUTROS PCT: 53.1 % (ref 43.0–77.0)
Neutro Abs: 2.5 10*3/uL (ref 1.4–7.7)
Platelets: 213 10*3/uL (ref 150.0–400.0)
RBC: 4.07 Mil/uL (ref 3.87–5.11)
RDW: 13.4 % (ref 11.5–15.5)
WBC: 4.6 10*3/uL (ref 4.0–10.5)

## 2016-07-10 LAB — LIPID PANEL
CHOL/HDL RATIO: 4
CHOLESTEROL: 260 mg/dL — AB (ref 0–200)
HDL: 71.9 mg/dL (ref >39.00)
LDL CALC: 175 mg/dL — AB (ref 0–99)
NONHDL: 188.04
Triglycerides: 63 mg/dL (ref 0.0–149.0)
VLDL: 12.6 mg/dL (ref 0.0–40.0)

## 2016-07-10 LAB — TSH: TSH: 1.04 u[IU]/mL (ref 0.35–4.50)

## 2016-07-17 ENCOUNTER — Encounter: Payer: Self-pay | Admitting: Family Medicine

## 2016-07-17 ENCOUNTER — Ambulatory Visit (INDEPENDENT_AMBULATORY_CARE_PROVIDER_SITE_OTHER): Payer: PPO | Admitting: Family Medicine

## 2016-07-17 DIAGNOSIS — Z0000 Encounter for general adult medical examination without abnormal findings: Secondary | ICD-10-CM

## 2016-07-17 NOTE — Progress Notes (Signed)
Subjective:     Patient ID: Christina Oliver, female   DOB: October 11, 1933, 83 y.o.   MRN: [id number]  HPI Patient seen for physical exam. She's been very busy caring for her debilitated husband past couple years but he seems to be making some slow recovery. Her weight is down 6 pounds from last year and she attributes some of that to less exercise and possible loss of muscle mass.  Takes no medications. She had mammogram in March 2016. She declines DEXA scans. She declines further colon cancer screening. Generally doing well with exception of very intermittent cough which usually improves with Mucinex. She's had some progressive arthritis pains in both hands.  No recent falls. Never smoked. No consistent alcohol use. She is retired [employer] of [employer] in [place]).    Past Medical History:  Diagnosis Date  . CONTUSION, LEG 01/30/2010  . SCIATICA, ACUTE 10/05/2008   Past Surgical History:  Procedure Laterality Date  . VEIN SURGERY     varicose, right leg    reports that she has never smoked. She has never used smokeless tobacco. She reports that she drinks alcohol. She reports that she does not use drugs. family history includes Cancer in her son. Allergies  Allergen Reactions  . Sulfadiazine     As a child     Review of Systems  Constitutional: Negative for activity change, appetite change, fatigue, fever and unexpected weight change.  HENT: Negative for ear pain, hearing loss, sore throat and trouble swallowing.   Eyes: Negative for visual disturbance.  Respiratory: Negative for cough and shortness of breath.   Cardiovascular: Negative for chest pain and palpitations.  Gastrointestinal: Negative for abdominal pain, blood in stool, constipation and diarrhea.  Genitourinary: Negative for dysuria and hematuria.  Musculoskeletal: Positive for arthralgias. Negative for back pain and myalgias.  Skin: Negative for rash.  Neurological: Negative for dizziness,  syncope and headaches.  Hematological: Negative for adenopathy.  Psychiatric/Behavioral: Negative for confusion and dysphoric mood.       Objective:   Physical Exam  Constitutional: She is oriented to person, place, and time. She appears well-developed and well-nourished.  HENT:  Head: Normocephalic and atraumatic.  Eyes: EOM are normal. Pupils are equal, round, and reactive to light.  Neck: Normal range of motion. Neck supple. No thyromegaly present.  Cardiovascular: Normal rate, regular rhythm and normal heart sounds.   No murmur heard. Pulmonary/Chest: Breath sounds normal. No respiratory distress. She has no wheezes. She has no rales.  Abdominal: Soft. Bowel sounds are normal. She exhibits no distension and no mass. There is no tenderness. There is no rebound and no guarding.  Musculoskeletal: Normal range of motion. She exhibits no edema.  Lymphadenopathy:    She has no cervical adenopathy.  Neurological: She is alert and oriented to person, place, and time. She displays normal reflexes. No cranial nerve deficit.  Skin: No rash noted.  Psychiatric: She has a normal mood and affect. Her behavior is normal. Judgment and thought content normal.       Assessment:     Physical exam. Labs reviewed. She has elevated lipids but otherwise fairly normal labs    Plan:     -Patient is adamant that she does not what to be on a statin. -Continue regular weightbearing exercise. -Offered DEXA scan and she declines -She will consider continuing every other year mammogram -Continue with yearly flu vaccine. Other immunizations up-to-date  Eulas Post MD [facility]

## 2016-07-17 NOTE — Patient Instructions (Signed)
Fall Prevention in the Home Falls can cause injuries and can affect people from all age groups. There are many simple things that you can do to make your home safe and to help prevent falls. What can I do on the outside of my home?  Regularly repair the edges of walkways and driveways and fix any cracks.  Remove high doorway thresholds.  Trim any shrubbery on the main path into your home.  Use bright outdoor lighting.  Clear walkways of debris and clutter, including tools and rocks.  Regularly check that handrails are securely fastened and in good repair. Both sides of any steps should have handrails.  Install guardrails along the edges of any raised decks or porches.  Have leaves, snow, and ice cleared regularly.  Use sand or salt on walkways during winter months.  In the garage, clean up any spills right away, including grease or oil spills. What can I do in the bathroom?  Use night lights.  Install grab bars by the toilet and in the tub and shower. Do not use towel bars as grab bars.  Use non-skid mats or decals on the floor of the tub or shower.  If you need to sit down while you are in the shower, use a plastic, non-slip stool.  Keep the floor dry. Immediately clean up any water that spills on the floor.  Remove soap buildup in the tub or shower on a regular basis.  Attach bath mats securely with double-sided non-slip rug tape.  Remove throw rugs and other tripping hazards from the floor. What can I do in the bedroom?  Use night lights.  Make sure that a bedside light is easy to reach.  Do not use oversized bedding that drapes onto the floor.  Have a firm chair that has side arms to use for getting dressed.  Remove throw rugs and other tripping hazards from the floor. What can I do in the kitchen?  Clean up any spills right away.  Avoid walking on wet floors.  Place frequently used items in easy-to-reach places.  If you need to reach for something above  you, use a sturdy step stool that has a grab bar.  Keep electrical cables out of the way.  Do not use floor polish or wax that makes floors slippery. If you have to use wax, make sure that it is non-skid floor wax.  Remove throw rugs and other tripping hazards from the floor. What can I do in the stairways?  Do not leave any items on the stairs.  Make sure that there are handrails on both sides of the stairs. Fix handrails that are broken or loose. Make sure that handrails are as long as the stairways.  Check any carpeting to make sure that it is firmly attached to the stairs. Fix any carpet that is loose or worn.  Avoid having throw rugs at the top or bottom of stairways, or secure the rugs with carpet tape to prevent them from moving.  Make sure that you have a light switch at the top of the stairs and the bottom of the stairs. If you do not have them, have them installed. What are some other fall prevention tips?  Wear closed-toe shoes that fit well and support your feet. Wear shoes that have rubber soles or low heels.  When you use a stepladder, make sure that it is completely opened and that the sides are firmly locked. Have someone hold the ladder while you are using   it. Do not climb a closed stepladder.  Add color or contrast paint or tape to grab bars and handrails in your home. Place contrasting color strips on the first and last steps.  Use mobility aids as needed, such as canes, walkers, scooters, and crutches.  Turn on lights if it is dark. Replace any light bulbs that burn out.  Set up furniture so that there are clear paths. Keep the furniture in the same spot.  Fix any uneven floor surfaces.  Choose a carpet design that does not hide the edge of steps of a stairway.  Be aware of any and all pets.  Review your medicines with your healthcare provider. Some medicines can cause dizziness or changes in blood pressure, which increase your risk of falling. Talk with  your health care provider about other ways that you can decrease your risk of falls. This may include working with a physical therapist or trainer to improve your strength, balance, and endurance. This information is not intended to replace advice given to you by your health care provider. Make sure you discuss any questions you have with your health care provider. Document Released: 05/25/2002 Document Revised: 11/01/2015 Document Reviewed: 07/09/2014 [employer] Interactive Patient Education  2017 [employer]  

## 2016-07-17 NOTE — Progress Notes (Signed)
Pre visit review using our clinic review tool, if applicable. No additional management support is needed unless otherwise documented below in the visit note. 

## 2016-07-23 ENCOUNTER — Encounter: Payer: Self-pay | Admitting: Family Medicine

## 2017-01-17 NOTE — Progress Notes (Addendum)
Subjective:   Christina Oliver is a 83 y.o. female who presents for Medicare Annual (Subsequent) preventive examination. The Patient was informed that the wellness visit is to identify future health risk and educate and initiate measures that can reduce risk for increased disease through the lifespan.    Annual Wellness Assessment  Reports health as  good.  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 Cholesterol 260; HDL 71; LDL direct is unknown triglycerides are 63. Glucose is normal Colonoscopy was in January 2009 no repeats Last known mammogram was March 2016-does SBE Known to decline DEXA scans. Did discuss bone quality is less as we ae and would need a dexa scan if she sustains a fx.   Describes Health as poor, fair, good or great? good  VS reviewed; Last blood pressures were 118/60; 120/70; 124/68 at her last checks She is not taking any medications.  .  Diet  3 meals a day- protein and vegetables Eats well  BMI 19- has lost a few pounds from 120, but still with in her normal range   Exercise  walks w spouse  [facility] Hx of walking 3 miles on the treadmill very fast. Did bike every day x15 mile.   Advanced Directives- completed  Patient Care Team: Christina Post, MD as PCP - General Assessed for additional providers  Immunization History  Administered Date(s) Administered  . Influenza Split 05/11/2011, 04/11/2012  . Influenza Whole 06/29/2010  . Influenza,inj,Quad PF,36+ Mos 04/08/2014  . Influenza-Unspecified 03/18/2013, 04/19/2015, 03/18/2016  .  Pneumococcal Conjugate-13 07/15/2015  . Pneumococcal Polysaccharide-23 06/26/2010  . Td 06/18/2002  . Tdap 07/11/2011   Required Immunizations needed today  Screening test up to date or reviewed for plan of completion Health Maintenance Due  Topic Date Due  . DEXA SCAN  05/23/2001  . INFLUENZA VACCINE  01/16/2017   Vaccination is not available at this time Declines DEXA but educated       Objective:     Vitals: Ht 5\' 4"  (1.626 m)   Wt 115 lb (52.2 kg)   BMI 19.74 kg/m   Body mass index is 19.74 kg/m.   Tobacco History  Smoking Status  . Never Smoker  Smokeless Tobacco  . Never Used     Counseling given: Yes   Past Medical History:  Diagnosis Date  . CONTUSION, LEG 01/30/2010  . SCIATICA, ACUTE 10/05/2008   Past Surgical History:  Procedure Laterality Date  . VEIN SURGERY     varicose, right leg   Family History  Problem Relation Age of Onset  . Cancer Son        leukemia   History  Sexual Activity  . Sexual activity: Not on file    No outpatient encounter prescriptions on file as of 01/18/2017.   No facility-administered encounter medications on file as of 01/18/2017.     Activities of Daily Living In your present state of health, do you have any difficulty performing the following activities: 01/18/2017  Hearing? N  Vision? N  Difficulty concentrating or making decisions?  N  Walking or climbing stairs? N  Dressing or bathing? N  Doing errands, shopping? N  Preparing Food and eating ? N  Using the Toilet? N  In the past six months, have you accidently leaked urine? N  Do you have problems with loss of bowel control? N  Managing your Medications? N  Managing your Finances? N  Housekeeping or managing your Housekeeping? N  Some recent data might be hidden    Patient Care Team: Christina Post, MD as PCP - General    Assessment:    Exercise Activities and Dietary recommendations Current Exercise Habits: Home exercise routine, Type of  exercise: walking, Time (Minutes): 30, Frequency (Times/Week): 3, Weekly Exercise (Minutes/Week): 90, Intensity: Moderate  Goals    . patient           Will develop a plan for self Biking, hiking. Whatever you can do outdoor w friends      Fall Risk Fall Risk  01/18/2017 07/15/2015 07/15/2015 04/08/2014  Falls in the past year? No No No No   Depression Screen PHQ 2/9 Scores 01/18/2017 07/15/2015 07/15/2015 04/08/2014  PHQ - 2 Score 0 0 0 0     Cognitive Function no issues; testing normal     6CIT Screen 01/18/2017  What Year? 0 points  What month? 0 points  What time? 0 points  Count back from 20 0 points  Months in reverse 0 points  Repeat phrase 0 points  Total Score 0    Immunization History  Administered Date(s) Administered  . Influenza Split 05/11/2011, 04/11/2012  . Influenza Whole 06/29/2010  . Influenza,inj,Quad PF,36+ Mos 04/08/2014  . Influenza-Unspecified 03/18/2013, 04/19/2015, 03/18/2016  . Pneumococcal Conjugate-13 07/15/2015  . Pneumococcal Polysaccharide-23 06/26/2010  . Td 06/18/2002  . Tdap 07/11/2011   Screening Tests Health Maintenance  Topic Date Due  . DEXA SCAN  05/23/2001  . INFLUENZA VACCINE  01/16/2017  . TETANUS/TDAP  07/10/2021  . PNA vac Low Risk Adult  Completed      Plan:     PCP Notes   Health Maintenance The patient had mammogram in 2016 Declines DEXA today. Did educate as to bone density versus bone quality. Educated that she was admitted DEXA if she sustained a fracture.  Had lengthy discussion regarding her caregiving role with spouse. Offer classes on balance to improve his independence. Mrs. Christina Oliver set a goal to try to allow herself time during the week long. She was an avid bike rider. Discussed getting involved with groups, friends or other so she has some time to decompress from caregiving. The patient is complaining of what may be GERD's. Stated she had spoken with Dr. Elease Oliver and has taken omeprazole 3 days.  Seems to be relieving her cough at this time. We'll send EMMI any information regarding foods that may aggravate reflux.   Abnormal Screens - none noted   Referrals  None given  Patient concerns; Has good coping skills and needs some time alone for herself. Is trying to get her spouse involved in some community activities. This seems to be helping him and her.  Nurse Concerns; None at present  Next PCP apt - generally schedules an annual appointment. Was seen in February of this year. We'll schedule as needed.      I have personally reviewed and noted the following in the patient's chart:   . Medical and social history . Use of alcohol, tobacco or illicit drugs  . Current medications and supplements . Functional ability and  status . Nutritional status . Physical activity . Advanced directives . List of other physicians . Hospitalizations, surgeries, and ER visits in previous 12 months . Vitals . Screenings to include cognitive, depression, and falls . Referrals and appointments  In addition, I have reviewed and discussed with patient certain preventive protocols, quality metrics, and best practice recommendations. A written personalized care plan for preventive services as well as general preventive health recommendations were provided to patient.     ZOXWR,UEAVW, RN  01/18/2017  Above reviewed and agree.  She has declined DEXA scans in the past.  Christina Post MD [facility]

## 2017-01-18 ENCOUNTER — Ambulatory Visit (INDEPENDENT_AMBULATORY_CARE_PROVIDER_SITE_OTHER): Payer: PPO

## 2017-01-18 VITALS — Ht 64.0 in | Wt 115.0 lb

## 2017-01-18 DIAGNOSIS — Z0000 Encounter for general adult medical examination without abnormal findings: Secondary | ICD-10-CM | POA: Diagnosis not present

## 2017-01-18 NOTE — Patient Instructions (Addendum)
Ms. Appleton , Thank you for taking time to come for your Medicare Wellness Visit. I appreciate your ongoing commitment to your health goals. Please review the following plan we discussed and let me know if I can assist you in the future.   "A Matter of Balance" Class  Wednesdays - April 2 to May 21  Date: 10/01/2012 3:00 PM - 4:30 PM  Cost: Free  Location: [facility] [place], [place]  Add to my Calendar   This is an evidence-based program designed to reduce the fear of falling and improve activity levels among older adults. Classes include group discussions, mutual problem solving, and exercises to improve strength, coordination and balance. Registration is required, as space is limited. Attendance at all sessions is necessary for successful completion of the class. Please call [phone] to register. Funding for the class is made possible by a grant from the [employer]. Contributions are accepted. The last day to join the class is Wednesday, April 16.    [place] and recreation; Tai Chi Will send emmi info to email regarding gerd  Prevention of falls: Remove rugs or any tripping hazards in the home Use Non slip mats in bathtubs and showers Placing grab bars next to the toilet and or shower Placing handrails on both sides of the stair way Adding extra lighting in the home.   Personal safety issues reviewed:  1. Consider starting a community watch program per [facility] 2.  Changes batteries is smoke detector and/or carbon monoxide detector  3.  If you have firearms; keep them in a safe place 4.  Wear protection when in the sun; Always wear sunscreen or a hat; It is good to have your doctor check your skin annually or review any new areas of concern 5. Driving safety; Keep in the right lane; stay 3 car lengths behind the car in front of you on the highway; look 3 times prior to  pulling out; carry your cell phone everywhere you go!    Learn about the Yellow Dot program:  The program allows first responders at your emergency to have access to who your physician is, as well as your medications and medical conditions.  Citizens requesting the Yellow Dot Packages should contact Master Corporal Nunzio Cobbs at the [facility] [phone] for the first week of the program and beginning the week after Easter citizens should contact their Scientist, physiological.     These are the goals we discussed: Goals    . patient           Will develop a plan for self Biking, hiking. Whatever you can do outdoor w friends       This is a list of the screening recommended for you and due dates:  Health Maintenance  Topic Date Due  . DEXA scan (bone density measurement)  05/23/2001  . Flu Shot  01/16/2017  . Tetanus Vaccine  07/10/2021  . Pneumonia vaccines  Completed   Summary: Preventive Care for Adults  A healthy lifestyle and preventive care can promote health and wellness. Preventive health guidelines for adults include the following key practices.  . A routine yearly physical is a good way to check with your health care provider about your health and preventive screening. It is a chance to share any concerns and updates on your health and to receive a thorough exam.  . Visit your dentist for  a routine exam and preventive care every 6 months. Brush your teeth twice a day and floss once a day. Good oral hygiene prevents tooth decay and gum disease.  . The frequency of eye exams is based on your age, health, family medical history, use  of contact lenses, and other factors. Follow your health care provider's ecommendations for frequency of eye exams.  . Eat a healthy diet. Foods like vegetables, fruits, whole grains, low-fat dairy products, and lean protein foods contain the nutrients you need without too many calories. Decrease your  intake of foods high in solid fats, added sugars, and salt. Eat the right amount of calories for you. Get information about a proper diet from your health care provider, if necessary.  . Regular physical exercise is one of the most important things you can do for your health. Most adults should get at least 150 minutes of moderate-intensity exercise (any activity that increases your heart rate and causes you to sweat) each week. In addition, most adults need muscle-strengthening exercises on 2 or more days a week.  Silver Sneakers may be a benefit available to you. To determine eligibility, you may visit the website: www.silversneakers.com or contact program at [phone] Mon-Fri between 8AM-8PM.   . Maintain a healthy weight. The body mass index (BMI) is a screening tool to identify possible weight problems. It provides an estimate of body fat based on height and weight. Your health care provider can find your BMI and can help you achieve or maintain a healthy weight.   For adults 20 years and older: ? A BMI below 18.5 is considered underweight. ? A BMI of 18.5 to 24.9 is normal. ? A BMI of 27 to 28 is considered normal by the Institutes of Health  ? A BMI of 30 and above is considered obese.   . Maintain normal blood lipids and cholesterol levels by exercising and minimizing your intake of saturated fat. Eat a balanced diet with plenty of fruit and vegetables. Blood tests for lipids and cholesterol should begin at age 39 and be repeated every 5 years. If your lipid or cholesterol levels are high, you are over 50, or you are at high risk for heart disease, you may need your cholesterol levels checked more frequently. Ongoing high lipid and cholesterol levels should be treated with medicines if diet and exercise are not working.  . If you smoke, find out from your health care provider how to quit. If you do not use tobacco, please do not start.  . If you choose to drink alcohol, please  do not consume more than one drink for women and 2 for men.  One drink is considered to be 12 ounces (355 mL) of beer, 5 ounces (148 mL) of wine, or 1.5 ounces (44 mL) of liquor. Moderation of alcohol intake to this level decreases your risk of breast cancer and liver damage.   . If you are 40-27 years old, ask your health care provider if you should take aspirin to prevent strokes.  . Use sunscreen. Apply sunscreen liberally and repeatedly throughout the day. You should seek shade when your shadow is shorter than you. Protect yourself by wearing long sleeves, pants, a wide-brimmed hat, and sunglasses year round, whenever you are outdoors.  . Once a month, do a whole body skin exam, using a mirror to look at the skin on your back. Tell your health care provider of new moles, moles that have irregular borders, moles that are larger than  a pencil eraser, or moles that have changed in shape or color.  Last, if you have completed an Advanced Directive; please bring a copy and review with your physician and then we will scan to the medical record      Health Maintenance, Female Adopting a healthy lifestyle and getting preventive care can go a long way to promote health and wellness. Talk with your health care provider about what schedule of regular examinations is right for you. This is a good chance for you to check in with your provider about disease prevention and staying healthy. In between checkups, there are plenty of things you can do on your own. Experts have done a lot of research about which lifestyle changes and preventive measures are most likely to keep you healthy. Ask your health care provider for more information. Weight and diet Eat a healthy diet  Be sure to include plenty of vegetables, fruits, low-fat dairy products, and lean protein.  Do not eat a lot of foods high in solid fats, added sugars, or salt.  Get regular exercise. This is one of the most important things you  can do for your health. ? Most adults should exercise for at least 150 minutes each week. The exercise should increase your heart rate and make you sweat (moderate-intensity exercise). ? Most adults should also do strengthening exercises at least twice a week. This is in addition to the moderate-intensity exercise.  Maintain a healthy weight  Body mass index (BMI) is a measurement that can be used to identify possible weight problems. It estimates body fat based on height and weight. Your health care provider can help determine your BMI and help you achieve or maintain a healthy weight.  For females 29 years of age and older: ? A BMI below 18.5 is considered underweight. ? A BMI of 18.5 to 24.9 is normal. ? A BMI of 25 to 29.9 is considered overweight. ? A BMI of 30 and above is considered obese.  Watch levels of cholesterol and blood lipids  You should start having your blood tested for lipids and cholesterol at 81 years of age, then have this test every 5 years.  You may need to have your cholesterol levels checked more often if: ? Your lipid or cholesterol levels are high. ? You are older than 81 years of age. ? You are at high risk for heart disease.  Cancer screening Lung Cancer  Lung cancer screening is recommended for adults 23-42 years old who are at high risk for lung cancer because of a history of smoking.  A yearly low-dose CT scan of the lungs is recommended for people who: ? Currently smoke. ? Have quit within the past 15 years. ? Have at least a 30-pack-year history of smoking. A pack year is smoking an average of one pack of cigarettes a day for 1 year.  Yearly screening should continue until it has been 15 years since you quit.  Yearly screening should stop if you develop a health problem that would prevent you from having lung cancer treatment.  Breast Cancer  Practice breast self-awareness. This means understanding how your breasts normally appear and  feel.  It also means doing regular breast self-exams. Let your health care provider know about any changes, no matter how small.  If you are in your 20s or 30s, you should have a clinical breast exam (CBE) by a health care provider every 1-3 years as part of a regular health exam.  If  you are 40 or older, have a CBE every year. Also consider having a breast X-ray (mammogram) every year.  If you have a family history of breast cancer, talk to your health care provider about genetic screening.  If you are at high risk for breast cancer, talk to your health care provider about having an MRI and a mammogram every year.  Breast cancer gene (BRCA) assessment is recommended for women who have family members with BRCA-related cancers. BRCA-related cancers include: ? Breast. ? Ovarian. ? Tubal. ? Peritoneal cancers.  Results of the assessment will determine the need for genetic counseling and BRCA1 and BRCA2 testing.  Cervical Cancer Your health care provider may recommend that you be screened regularly for cancer of the pelvic organs (ovaries, uterus, and vagina). This screening involves a pelvic examination, including checking for microscopic changes to the surface of your cervix (Pap test). You may be encouraged to have this screening done every 3 years, beginning at age 1.  For women ages 29-65, health care providers may recommend pelvic exams and Pap testing every 3 years, or they may recommend the Pap and pelvic exam, combined with testing for human papilloma virus (HPV), every 5 years. Some types of HPV increase your risk of cervical cancer. Testing for HPV may also be done on women of any age with unclear Pap test results.  Other health care providers may not recommend any screening for nonpregnant women who are considered low risk for pelvic cancer and who do not have symptoms. Ask your health care provider if a screening pelvic exam is right for you.  If you have had past treatment for  cervical cancer or a condition that could lead to cancer, you need Pap tests and screening for cancer for at least 20 years after your treatment. If Pap tests have been discontinued, your risk factors (such as having a new sexual partner) need to be reassessed to determine if screening should resume. Some women have medical problems that increase the chance of getting cervical cancer. In these cases, your health care provider may recommend more frequent screening and Pap tests.  Colorectal Cancer  This type of cancer can be detected and often prevented.  Routine colorectal cancer screening usually begins at 81 years of age and continues through 81 years of age.  Your health care provider may recommend screening at an earlier age if you have risk factors for colon cancer.  Your health care provider may also recommend using home test kits to check for hidden blood in the stool.  A small camera at the end of a tube can be used to examine your colon directly (sigmoidoscopy or colonoscopy). This is done to check for the earliest forms of colorectal cancer.  Routine screening usually begins at age 15.  Direct examination of the colon should be repeated every 5-10 years through 81 years of age. However, you may need to be screened more often if early forms of precancerous polyps or small growths are found.  Skin Cancer  Check your skin from head to toe regularly.  Tell your health care provider about any new moles or changes in moles, especially if there is a change in a mole's shape or color.  Also tell your health care provider if you have a mole that is larger than the size of a pencil eraser.  Always use sunscreen. Apply sunscreen liberally and repeatedly throughout the day.  Protect yourself by wearing long sleeves, pants, a wide-brimmed hat, and sunglasses whenever  you are outside.  Heart disease, diabetes, and high blood pressure  High blood pressure causes heart disease and increases  the risk of stroke. High blood pressure is more likely to develop in: ? People who have blood pressure in the high end of the normal range (130-139/85-89 mm Hg). ? People who are overweight or obese. ? People who are African American.  If you are 6-74 years of age, have your blood pressure checked every 3-5 years. If you are 65 years of age or older, have your blood pressure checked every year. You should have your blood pressure measured twice-once when you are at a hospital or clinic, and once when you are not at a hospital or clinic. Record the average of the two measurements. To check your blood pressure when you are not at a hospital or clinic, you can use: ? An automated blood pressure machine at a pharmacy. ? A home blood pressure monitor.  If you are between 25 years and 67 years old, ask your health care provider if you should take aspirin to prevent strokes.  Have regular diabetes screenings. This involves taking a blood sample to check your fasting blood sugar level. ? If you are at a normal weight and have a low risk for diabetes, have this test once every three years after 80 years of age. ? If you are overweight and have a high risk for diabetes, consider being tested at a younger age or more often. Preventing infection Hepatitis B  If you have a higher risk for hepatitis B, you should be screened for this virus. You are considered at high risk for hepatitis B if: ? You were born in a country where hepatitis B is common. Ask your health care provider which countries are considered high risk. ? Your parents were born in a high-risk country, and you have not been immunized against hepatitis B (hepatitis B vaccine). ? You have HIV or AIDS. ? You use needles to inject street drugs. ? You live with someone who has hepatitis B. ? You have had sex with someone who has hepatitis B. ? You get hemodialysis treatment. ? You take certain medicines for conditions, including cancer, organ  transplantation, and autoimmune conditions.  Hepatitis C  Blood testing is recommended for: ? Everyone born from [birth year] through 1963. ? Anyone with known risk factors for hepatitis C.  Sexually transmitted infections (STIs)  You should be screened for sexually transmitted infections (STIs) including gonorrhea and chlamydia if: ? You are sexually active and are younger than 81 years of age. ? You are older than 81 years of age and your health care provider tells you that you are at risk for this type of infection. ? Your sexual activity has changed since you were last screened and you are at an increased risk for chlamydia or gonorrhea. Ask your health care provider if you are at risk.  If you do not have HIV, but are at risk, it may be recommended that you take a prescription medicine daily to prevent HIV infection. This is called pre-exposure prophylaxis (PrEP). You are considered at risk if: ? You are sexually active and do not regularly use condoms or know the HIV status of your partner(s). ? You take drugs by injection. ? You are sexually active with a partner who has HIV.  Talk with your health care provider about whether you are at high risk of being infected with HIV. If you choose to begin PrEP, you should first  be tested for HIV. You should then be tested every 3 months for as long as you are taking PrEP. Pregnancy  If you are premenopausal and you may become pregnant, ask your health care provider about preconception counseling.  If you may become pregnant, take 400 to 800 micrograms (mcg) of folic acid every day.  If you want to prevent pregnancy, talk to your health care provider about birth control (contraception). Osteoporosis and menopause  Osteoporosis is a disease in which the bones lose minerals and strength with aging. This can result in serious bone fractures. Your risk for osteoporosis can be identified using a bone density scan.  If you are 45 years of age or  older, or if you are at risk for osteoporosis and fractures, ask your health care provider if you should be screened.  Ask your health care provider whether you should take a calcium or vitamin D supplement to lower your risk for osteoporosis.  Menopause may have certain physical symptoms and risks.  Hormone replacement therapy may reduce some of these symptoms and risks. Talk to your health care provider about whether hormone replacement therapy is right for you. Follow these instructions at home:  Schedule regular health, dental, and eye exams.  Stay current with your immunizations.  Do not use any tobacco products including cigarettes, chewing tobacco, or electronic cigarettes.  If you are pregnant, do not drink alcohol.  If you are breastfeeding, limit how much and how often you drink alcohol.  Limit alcohol intake to no more than 1 drink per day for nonpregnant women. One drink equals 12 ounces of beer, 5 ounces of wine, or 1 ounces of hard liquor.  Do not use street drugs.  Do not share needles.  Ask your health care provider for help if you need support or information about quitting drugs.  Tell your health care provider if you often feel depressed.  Tell your health care provider if you have ever been abused or do not feel safe at home. This information is not intended to replace advice given to you by your health care provider. Make sure you discuss any questions you have with your health care provider. Document Released: 12/18/2010 Document Revised: 11/10/2015 Document Reviewed: 03/08/2015 Elsevier Interactive Patient Education  [employer].

## 2017-01-29 ENCOUNTER — Encounter: Payer: Self-pay | Admitting: Family Medicine

## 2017-03-07 ENCOUNTER — Encounter: Payer: Self-pay | Admitting: Family Medicine

## 2017-03-12 ENCOUNTER — Encounter: Payer: Self-pay | Admitting: Family Medicine

## 2017-03-20 ENCOUNTER — Ambulatory Visit (INDEPENDENT_AMBULATORY_CARE_PROVIDER_SITE_OTHER): Payer: PPO | Admitting: Family Medicine

## 2017-03-20 ENCOUNTER — Encounter: Payer: Self-pay | Admitting: Family Medicine

## 2017-03-20 VITALS — BP 110/70 | HR 58 | Temp 97.5°F | Wt 115.4 lb

## 2017-03-20 DIAGNOSIS — K5900 Constipation, unspecified: Secondary | ICD-10-CM

## 2017-03-20 DIAGNOSIS — K219 Gastro-esophageal reflux disease without esophagitis: Secondary | ICD-10-CM | POA: Diagnosis not present

## 2017-03-20 NOTE — Patient Instructions (Signed)
Constipation, Adult Constipation is when a person has fewer bowel movements in a week than normal, has difficulty having a bowel movement, or has stools that are dry, hard, or larger than normal. Constipation may be caused by an underlying condition. It may become worse with age if a person takes certain medicines and does not take in enough fluids. Follow these instructions at home: Eating and drinking   Eat foods that have a lot of fiber, such as fresh fruits and vegetables, whole grains, and beans.  Limit foods that are high in fat, low in fiber, or overly processed, such as french fries, hamburgers, cookies, candies, and soda.  Drink enough fluid to keep your urine clear or pale yellow. General instructions  Exercise regularly or as told by your health care provider.  Go to the restroom when you have the urge to go. Do not hold it in.  Take over-the-counter and prescription medicines only as told by your health care provider. These include any fiber supplements.  Practice pelvic floor retraining exercises, such as deep breathing while relaxing the lower abdomen and pelvic floor relaxation during bowel movements.  Watch your condition for any changes.  Keep all follow-up visits as told by your health care provider. This is important. Contact a health care provider if:  You have pain that gets worse.  You have a fever.  You do not have a bowel movement after 4 days.  You vomit.  You are not hungry.  You lose weight.  You are bleeding from the anus.  You have thin, pencil-like stools. Get help right away if:  You have a fever and your symptoms suddenly get worse.  You leak stool or have blood in your stool.  Your abdomen is bloated.  You have severe pain in your abdomen.  You feel dizzy or you faint. This information is not intended to replace advice given to you by your health care provider. Make sure you discuss any questions you have with your health care  provider. Document Released: 03/02/2004 Document Revised: 12/23/2015 Document Reviewed: 11/23/2015 Elsevier Interactive Patient Education  2017 [place] for Gastroesophageal Reflux Disease, Adult When you have gastroesophageal reflux disease (GERD), the foods you eat and your eating habits are very important. Choosing the right foods can help ease the discomfort of GERD. Consider working with a diet and nutrition specialist (dietitian) to help you make healthy food choices. What general guidelines should I follow? Eating plan  Choose healthy foods low in fat, such as fruits, vegetables, whole grains, low-fat dairy products, and lean meat, fish, and poultry.  Eat frequent, small meals instead of three large meals each day. Eat your meals slowly, in a relaxed setting. Avoid bending over or lying down until 2-3 hours after eating.  Limit high-fat foods such as fatty meats or fried foods.  Limit your intake of oils, butter, and shortening to less than 8 teaspoons each day.  Avoid the following: ? Foods that cause symptoms. These may be different for different people. Keep a food diary to keep track of foods that cause symptoms. ? Alcohol. ? Drinking large amounts of liquid with meals. ? Eating meals during the 2-3 hours before bed.  Cook foods using methods other than frying. This may include baking, grilling, or broiling. Lifestyle   Maintain a healthy weight. Ask your health care provider what weight is healthy for you. If you need to lose weight, work with your health care provider to do so safely.  Exercise for at least 30 minutes on 5 or more days each week, or as told by your health care provider.  Avoid wearing clothes that fit tightly around your waist and chest.  Do not use any products that contain nicotine or tobacco, such as cigarettes and e-cigarettes. If you need help quitting, ask your health care provider.  Sleep with the head of your bed raised. Use a  wedge under the mattress or blocks under the bed frame to raise the head of the bed. What foods are not recommended? The items listed may not be a complete list. Talk with your dietitian about what dietary choices are best for you. Grains Pastries or quick breads with added fat. [place] toast. Vegetables Deep fried vegetables. [place] fries. Any vegetables prepared with added fat. Any vegetables that cause symptoms. For some people this may include tomatoes and tomato products, chili peppers, onions and garlic, and horseradish. Fruits Any fruits prepared with added fat. Any fruits that cause symptoms. For some people this may include citrus fruits, such as oranges, grapefruit, pineapple, and lemons. Meats and other protein foods High-fat meats, such as fatty beef or pork, hot dogs, ribs, ham, sausage, salami and bacon. Fried meat or protein, including fried fish and fried chicken. Nuts and nut butters. Dairy Whole milk and chocolate milk. Sour cream. Cream. Ice cream. Cream cheese. Milk shakes. Beverages Coffee and tea, with or without caffeine. Carbonated beverages. Sodas. Energy drinks. Fruit juice made with acidic fruits (such as orange or grapefruit). Tomato juice. Alcoholic drinks. Fats and oils Butter. Margarine. Shortening. Ghee. Sweets and desserts Chocolate and cocoa. Donuts. Seasoning and other foods Pepper. Peppermint and spearmint. Any condiments, herbs, or seasonings that cause symptoms. For some people, this may include curry, hot sauce, or vinegar-based salad dressings. Summary  When you have gastroesophageal reflux disease (GERD), food and lifestyle choices are very important to help ease the discomfort of GERD.  Eat frequent, small meals instead of three large meals each day. Eat your meals slowly, in a relaxed setting. Avoid bending over or lying down until 2-3 hours after eating.  Limit high-fat foods such as fatty meat or fried foods. This information is not intended to  replace advice given to you by your health care provider. Make sure you discuss any questions you have with your health care provider. Document Released: 06/04/2005 Document Revised: 06/05/2016 Document Reviewed: 06/05/2016 Elsevier Interactive Patient Education  2017 [employer].

## 2017-03-20 NOTE — Progress Notes (Signed)
Subjective:     Patient ID: Christina Oliver, female   DOB: 06-Aug-1933, 83 y.o.   MRN: [id number]  HPI   Patient seen with reflux-type symptoms intermittently past few months. She has not had any appetite change and no dysphagia. No pain with swallowing. Her weight is down slightly and she attributes that to change in activity levels. She took Prilosec OTC for 14 days had improvement and then switched over to Zantac 50 mgs twice daily which is controlling symptoms fairly well. She's made some dietary modifications and overall symptoms are improving. She's also had some intermittent constipation issues with somewhat less frequent stool and also small pellet-like stool. She's trying to get more fiber. No abdominal pain.  Past Medical History:  Diagnosis Date  . CONTUSION, LEG 01/30/2010  . SCIATICA, ACUTE 10/05/2008   Past Surgical History:  Procedure Laterality Date  . VEIN SURGERY     varicose, right leg    reports that she has never smoked. She has never used smokeless tobacco. She reports that she drinks alcohol. She reports that she does not use drugs. family history includes Cancer in her son. Allergies  Allergen Reactions  . Sulfadiazine     As a child     Review of Systems  Constitutional: Negative for appetite change, fatigue and unexpected weight change.  HENT: Negative for trouble swallowing.   Eyes: Negative for visual disturbance.  Respiratory: Negative for cough, chest tightness, shortness of breath and wheezing.   Cardiovascular: Negative for chest pain, palpitations and leg swelling.  Gastrointestinal: Positive for constipation. Negative for abdominal pain, diarrhea, nausea and vomiting.  Neurological: Negative for dizziness, seizures, syncope, weakness, light-headedness and headaches.       Objective:   Physical Exam  Constitutional: She appears well-developed and well-nourished.  Eyes: Pupils are equal, round, and reactive to light.  Neck: Neck supple. No JVD  present. No thyromegaly present.  Cardiovascular: Normal rate and regular rhythm.  Exam reveals no gallop.   Pulmonary/Chest: Effort normal and breath sounds normal. No respiratory distress. She has no wheezes. She has no rales.  Abdominal: Soft. She exhibits no mass. There is no tenderness. There is no rebound and no guarding.  Musculoskeletal: She exhibits no edema.  Neurological: She is alert.       Assessment:     #1 GERD. Fairly well controlled with Zantac. She does not have any red flags such as dysphagia. Her weight is down slightly from over a year ago but she states she has excellent appetite  #2 constipation    Plan:     -Discussed lifestyle management of constipation and GERD  -Continue ranitidine as needed for GERD symptoms -Recommend 25 g fiber per day -Recommend regular exercise such as walking and plenty of fluids -Touch base if GERD symptoms not improved with the above. Also reiterated elevate head of bed 4-6 inches if possible and avoid eating within 2-3 hours of bedtime  Eulas Post MD [facility] at [facility]

## 2017-06-24 DIAGNOSIS — L718 Other rosacea: Secondary | ICD-10-CM | POA: Diagnosis not present

## 2017-06-24 DIAGNOSIS — L821 Other seborrheic keratosis: Secondary | ICD-10-CM | POA: Diagnosis not present

## 2017-06-24 DIAGNOSIS — D225 Melanocytic nevi of trunk: Secondary | ICD-10-CM | POA: Diagnosis not present

## 2017-06-24 DIAGNOSIS — L918 Other hypertrophic disorders of the skin: Secondary | ICD-10-CM | POA: Diagnosis not present

## 2017-07-22 ENCOUNTER — Ambulatory Visit (INDEPENDENT_AMBULATORY_CARE_PROVIDER_SITE_OTHER): Payer: Medicare HMO | Admitting: Family Medicine

## 2017-07-22 ENCOUNTER — Encounter: Payer: Self-pay | Admitting: Family Medicine

## 2017-07-22 VITALS — BP 118/78 | HR 77 | Temp 97.5°F | Ht 63.5 in | Wt 117.2 lb

## 2017-07-22 DIAGNOSIS — Z0000 Encounter for general adult medical examination without abnormal findings: Secondary | ICD-10-CM

## 2017-07-22 NOTE — Progress Notes (Signed)
Subjective:     Patient ID: Christina Oliver, female   DOB: 1933-09-26, 84 y.o.   MRN: [id number]  HPI Patient seen for physical exam. Generally very healthy. She has history of hyperlipidemia and normocytic anemia.  She's declined several health maintenance issues in the past including regular mammograms, DEXA scan.  She is willing to consider possible repeat mammogram this year. Has been almost 3 years since last mammogram. She has some occasional GERD symptoms which are mostly controlled with ranitidine but occasional breakthrough symptoms  Had some recent left shoulder pains and she thinks this is related to helping lift and support her husband as he ambulates. He has several health issues. She states she has small caliber pellet-like stools frequently. Not sure how much fiber she is getting. Tries to drink plenty of fluids.  Past Medical History:  Diagnosis Date  . CONTUSION, LEG 01/30/2010  . SCIATICA, ACUTE 10/05/2008   Past Surgical History:  Procedure Laterality Date  . VEIN SURGERY     varicose, right leg    reports that  has never smoked. she has never used smokeless tobacco. She reports that she drinks alcohol. She reports that she does not use drugs. family history includes Cancer in her son. Allergies  Allergen Reactions  . Sulfadiazine     As a child     Review of Systems  Constitutional: Negative for activity change, appetite change, fatigue, fever and unexpected weight change.  HENT: Negative for ear pain, hearing loss, sore throat and trouble swallowing.   Eyes: Negative for visual disturbance.  Respiratory: Negative for cough and shortness of breath.   Cardiovascular: Negative for chest pain and palpitations.  Gastrointestinal: Positive for constipation. Negative for abdominal pain, blood in stool and diarrhea.  Genitourinary: Negative for dysuria and hematuria.  Musculoskeletal: Negative for back pain and myalgias.  Skin: Negative for rash.  Neurological:  Negative for dizziness, syncope and headaches.  Hematological: Negative for adenopathy.  Psychiatric/Behavioral: Negative for confusion and dysphoric mood.       Objective:   Physical Exam  Constitutional: She is oriented to person, place, and time. She appears well-developed and well-nourished.  HENT:  Head: Normocephalic and atraumatic.  Eyes: EOM are normal. Pupils are equal, round, and reactive to light.  Neck: Normal range of motion. Neck supple. No thyromegaly present.  Cardiovascular: Normal rate, regular rhythm and normal heart sounds.  No murmur heard. Pulmonary/Chest: Breath sounds normal. No respiratory distress. She has no wheezes. She has no rales.  Abdominal: Soft. Bowel sounds are normal. She exhibits no distension and no mass. There is no tenderness. There is no rebound and no guarding.  Musculoskeletal: Normal range of motion. She exhibits no edema.  Lymphadenopathy:    She has no cervical adenopathy.  Neurological: She is alert and oriented to person, place, and time. She displays normal reflexes. No cranial nerve deficit.  Skin: No rash noted.  Psychiatric: She has a normal mood and affect. Her behavior is normal. Judgment and thought content normal.       Assessment:     Physical exam. The following issues were addressed    Plan:     -Consider repeat mammogram this year -Discussed shingles vaccine and she will consider -Patient declines any lab work today -Declines DEXA scanning -Lifestyle management of GERD discussed -Increase fiber intake to 25 g or more per day  Eulas Post MD [facility]

## 2017-07-22 NOTE — Patient Instructions (Addendum)
Consider new shingles vaccine- Shingrix Consider repeat mammogram this year.     Fiber Content in Foods See the following list for the dietary fiber content of some common foods. High-fiber foods High-fiber foods contain 4 grams or more (4g or more) of fiber per serving. They include:  Artichoke (fresh) - 1 medium has 10.3g of fiber.  Baked beans, plain or vegetarian (canned) -  cup has 5.2g of fiber.  Blackberries or raspberries (fresh) -  cup has 4g of fiber.  Bran cereal -  cup has 8.6g of fiber.  Bulgur (cooked) -  cup has 4g of fiber.  Kidney beans (canned) -  cup has 6.8g of fiber.  Lentils (cooked) -  cup has 7.8g of fiber.  Pear (fresh) - 1 medium has 5.1g of fiber.  Peas (frozen) -  cup has 4.4g of fiber.  Pinto beans (canned) -  cup has 5.5g of fiber.  Pinto beans (dried and cooked) -  cup has 7.7g of fiber.  Potato with skin (baked) - 1 medium has 4.4g of fiber.  Quinoa (cooked) -  cup has 5g of fiber.  Soybeans (canned, frozen, or fresh) -  cup has 5.1g of fiber.  Moderate-fiber foods Moderate-fiber foods contain 1-4 grams (1-4g) of fiber per serving. They include:  Almonds - 1 oz. has 3.5g of fiber.  Apple with skin - 1 medium has 3.3g of fiber.  Applesauce, sweetened -  cup has 1.5g of fiber.  Bagel, plain - one 4-inch (10-cm) bagel has 2g of fiber.  Banana - 1 medium has 3.1g of fiber.  Broccoli (cooked) -  cup has 2.5g of fiber.  Carrots (cooked) -  cup has 2.3g of fiber.  Corn (canned or frozen) -  cup has 2.1g of fiber.  Corn tortilla - one 6-inch (15-cm) tortilla has 1.5g of fiber.  Green beans (canned) -  cup has 2g of fiber.  Instant oatmeal -  cup has about 2g of fiber.  Long-grain brown rice (cooked) - 1 cup has 3.5g of fiber.  Macaroni, enriched (cooked) - 1 cup has 2.5g of fiber.  Melon - 1 cup has 1.4g of fiber.  Multigrain cereal -  cup has about 2-4g of fiber.  Orange - 1 small has 3.1g of  fiber.  Potatoes, mashed -  cup has 1.6g of fiber.  Raisins - 1/4 cup has 1.6g of fiber.  Squash -  cup has 2.9g of fiber.  Sunflower seeds -  cup has 1.1g of fiber.  Tomato - 1 medium has 1.5g of fiber.  Vegetable or soy patty - 1 has 3.4g of fiber.  Whole-wheat bread - 1 slice has 2g of fiber.  Whole-wheat spaghetti -  cup has 3.2g of fiber.  Low-fiber foods Low-fiber foods contain less than 1 gram (less than 1g) of fiber per serving. They include:  Egg - 1 large.  Flour tortilla - one 6-inch (15-cm) tortilla.  Fruit juice -  cup.  Lettuce - 1 cup.  Meat, poultry, or fish - 1 oz.  Milk - 1 cup.  Spinach (raw) - 1 cup.  White bread - 1 slice.  White rice -  cup.  Yogurt -  cup.  Actual amounts of fiber in foods may be different depending on processing. Talk with your dietitian about how much fiber you need in your diet. This information is not intended to replace advice given to you by your health care provider. Make sure you discuss any questions you have with your health  care provider. Document Released: 10/21/2006 Document Revised: 11/10/2015 Document Reviewed: 07/28/2015 Elsevier Interactive Patient Education  2018 [place] for Gastroesophageal Reflux Disease, Adult When you have gastroesophageal reflux disease (GERD), the foods you eat and your eating habits are very important. Choosing the right foods can help ease the discomfort of GERD. Consider working with a diet and nutrition specialist (dietitian) to help you make healthy food choices. What general guidelines should I follow? Eating plan  Choose healthy foods low in fat, such as fruits, vegetables, whole grains, low-fat dairy products, and lean meat, fish, and poultry.  Eat frequent, small meals instead of three large meals each day. Eat your meals slowly, in a relaxed setting. Avoid bending over or lying down until 2-3 hours after eating.  Limit high-fat foods such as  fatty meats or fried foods.  Limit your intake of oils, butter, and shortening to less than 8 teaspoons each day.  Avoid the following: ? Foods that cause symptoms. These may be different for different people. Keep a food diary to keep track of foods that cause symptoms. ? Alcohol. ? Drinking large amounts of liquid with meals. ? Eating meals during the 2-3 hours before bed.  Cook foods using methods other than frying. This may include baking, grilling, or broiling. Lifestyle   Maintain a healthy weight. Ask your health care provider what weight is healthy for you. If you need to lose weight, work with your health care provider to do so safely.  Exercise for at least 30 minutes on 5 or more days each week, or as told by your health care provider.  Avoid wearing clothes that fit tightly around your waist and chest.  Do not use any products that contain nicotine or tobacco, such as cigarettes and e-cigarettes. If you need help quitting, ask your health care provider.  Sleep with the head of your bed raised. Use a wedge under the mattress or blocks under the bed frame to raise the head of the bed. What foods are not recommended? The items listed may not be a complete list. Talk with your dietitian about what dietary choices are best for you. Grains Pastries or quick breads with added fat. [place] toast. Vegetables Deep fried vegetables. [place] fries. Any vegetables prepared with added fat. Any vegetables that cause symptoms. For some people this may include tomatoes and tomato products, chili peppers, onions and garlic, and horseradish. Fruits Any fruits prepared with added fat. Any fruits that cause symptoms. For some people this may include citrus fruits, such as oranges, grapefruit, pineapple, and lemons. Meats and other protein foods High-fat meats, such as fatty beef or pork, hot dogs, ribs, ham, sausage, salami and bacon. Fried meat or protein, including fried fish and fried  chicken. Nuts and nut butters. Dairy Whole milk and chocolate milk. Sour cream. Cream. Ice cream. Cream cheese. Milk shakes. Beverages Coffee and tea, with or without caffeine. Carbonated beverages. Sodas. Energy drinks. Fruit juice made with acidic fruits (such as orange or grapefruit). Tomato juice. Alcoholic drinks. Fats and oils Butter. Margarine. Shortening. Ghee. Sweets and desserts Chocolate and cocoa. Donuts. Seasoning and other foods Pepper. Peppermint and spearmint. Any condiments, herbs, or seasonings that cause symptoms. For some people, this may include curry, hot sauce, or vinegar-based salad dressings. Summary  When you have gastroesophageal reflux disease (GERD), food and lifestyle choices are very important to help ease the discomfort of GERD.  Eat frequent, small meals instead of three large meals each day. Eat  your meals slowly, in a relaxed setting. Avoid bending over or lying down until 2-3 hours after eating.  Limit high-fat foods such as fatty meat or fried foods. This information is not intended to replace advice given to you by your health care provider. Make sure you discuss any questions you have with your health care provider. Document Released: 06/04/2005 Document Revised: 06/05/2016 Document Reviewed: 06/05/2016 Elsevier Interactive Patient Education  [employer].

## 2017-07-26 DIAGNOSIS — Z1231 Encounter for screening mammogram for malignant neoplasm of breast: Secondary | ICD-10-CM | POA: Diagnosis not present

## 2017-11-05 DIAGNOSIS — R69 Illness, unspecified: Secondary | ICD-10-CM | POA: Diagnosis not present

## 2017-11-06 DIAGNOSIS — Z008 Encounter for other general examination: Secondary | ICD-10-CM | POA: Diagnosis not present

## 2017-12-31 ENCOUNTER — Encounter: Payer: Self-pay | Admitting: Family Medicine

## 2018-01-19 NOTE — Progress Notes (Signed)
Subjective:   Christina Oliver is a 84 y.o. female who presents for Medicare Annual (Subsequent) preventive examination.  Reports health as good Grew up in [place] 4 living children  Lives in the [place] or [place]   Noted to be a caregiver for her husband. 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  She does 100% of the work to be done in the home  Has been a caregiver her entire life;  Older sister was disabled and is 56 and lives in a group home  Has a brother in [place] and will pick up her sister  May need caregiver for respite in the future  States he is able except for balance  Discussed private pay for respite or emergency,  Diet  Am pouched egg Lunch leftovers; meal Dinner protein, lentils, fruit and vegetables Salads every night  Bowl of ice cream at dinner  Taking good care of acid reflux Does not eat chocolate of coffee, it is under control Zantac  3 meals a day- protein and vegetables Has fruit   BMI 19- has lost a few pounds from 120, but still with in her normal range    Exercise  walks w spouse  Has several acres of land and still push mows 15 miles total for the entire yard  Stays at home and is a total care now Spouse walks and sits     Health Maintenance Due  Topic Date Due  . INFLUENZA VACCINE  01/16/2018   Cardiac Risk Factors include: advanced age (>9men, >40 women);dyslipidemia   Dexa scan 2002  Declines at this time;  [facility] mammogram was done in the last year      Objective:     Vitals: BP 118/60   Pulse 64   Ht 5' 3.5" (1.613 m)   Wt 110 lb 5 oz (50 kg)   SpO2 97%   BMI 19.23 kg/m   Body mass index is 19.23 kg/m.  Advanced Directives 01/21/2018 01/18/2017  Does Patient Have a Medical Advance Directive? Yes  Yes    Tobacco Social History   Tobacco Use  Smoking Status Never Smoker  Smokeless Tobacco Never Used     Counseling given: Yes   Clinical Intake:   Past Medical History:  Diagnosis Date  . CONTUSION, LEG 01/30/2010  . SCIATICA, ACUTE 10/05/2008   Past Surgical History:  Procedure Laterality Date  . VEIN SURGERY     varicose, right leg   Family History  Problem Relation Age of Onset  . Cancer Son        leukemia   Social History   Socioeconomic History  . Marital status: Married    Spouse name: Not on file  . Number of children: Not on file  . Years of education: Not on file  . Highest education level: Not on file  Occupational History  . Not on file  Social Needs  . Financial resource strain: Not on file  . Food insecurity:    Worry: Not on file    Inability: Not on file  . Transportation needs:    Medical: Not on file    Non-medical: Not on file  Tobacco Use  . Smoking status: Never Smoker  . Smokeless  tobacco: Never Used  Substance and Sexual Activity  . Alcohol use: Yes  . Drug use: No  . Sexual activity: Not on file  Lifestyle  . Physical activity:    Days per week: Not on file    Minutes per session: Not on file  . Stress: Not on file  Relationships  . Social connections:    Talks on phone: Not on file    Gets together: Not on file    Attends religious service: Not on file    Active member of club or organization: Not on file    Attends meetings of clubs or organizations: Not on file    Relationship status: Not on file  Other Topics Concern  . Not on file  Social History Narrative  . Not on file    Outpatient Encounter Medications as of 01/21/2018  Medication Sig  . ranitidine (ZANTAC) 150 MG tablet Take 150 mg by mouth 2 (two) times daily.   No facility-administered encounter medications on file as of 01/21/2018.     Activities of Daily Living In your present state of health, do you have any difficulty performing the following  activities: 01/21/2018  Hearing? N  Vision? N  Difficulty concentrating or making decisions? N  Walking or climbing stairs? N  Dressing or bathing? N  Doing errands, shopping? N  Preparing Food and eating ? N  Using the Toilet? N  In the past six months, have you accidently leaked urine? N  Do you have problems with loss of bowel control? Y  Managing your Medications? N  Managing your Finances? N  Housekeeping or managing your Housekeeping? N  Some recent data might be hidden    Patient Care Team: Eulas Post, MD as PCP - General    Assessment:   This is a routine wellness examination for Christina Oliver.  Exercise Activities and Dietary recommendations Current Exercise Habits: Home exercise routine, Type of exercise: walking(does 17 miles when mowing ), Time (Minutes): 60, Frequency (Times/Week): 6, Weekly Exercise (Minutes/Week): 360, Intensity: Moderate  Goals    . Patient Stated     To maintain lifestyle !        Fall Risk Fall Risk  01/21/2018 01/18/2017 07/15/2015 07/15/2015 04/08/2014  Falls in the past year? No No No No No     Depression Screen PHQ 2/9 Scores 01/21/2018 01/18/2017 07/15/2015 07/15/2015  PHQ - 2 Score 0 0 0 0     Cognitive Function Ad8 score reviewed for issues:  Issues making decisions:  Less interest in hobbies / activities:  Repeats questions, stories (family complaining):  Trouble using ordinary gadgets (microwave, computer, phone):  Forgets the month or year:   Mismanaging finances:   Remembering appts:  Daily problems with thinking and/or memory: Ad8 score is=0     MMSE - Mini Mental State Exam 01/21/2018  Not completed: (No Data)    Ad8 score 0- Full time caregiver No issues at this time per the patient report  6CIT Screen 01/18/2017  What Year? 0 points  What month? 0 points  What time? 0 points  Count back from 20 0 points  Months in reverse 0 points  Repeat phrase 0 points  Total Score 0    Immunization History    Administered Date(s) Administered  . Influenza Split 05/11/2011, 04/11/2012  . Influenza Whole 06/29/2010  . Influenza,inj,Quad PF,6+ Mos 04/08/2014  . Influenza-Unspecified 03/18/2013, 04/19/2015, 03/18/2016, 02/04/2017  . Pneumococcal Conjugate-13 07/15/2015  . Pneumococcal Polysaccharide-23 06/26/2010  . Td 06/18/2002  .  Tdap 07/11/2011     Screening Tests Health Maintenance  Topic Date Due  . INFLUENZA VACCINE  01/16/2018  . DEXA SCAN  01/22/2019 (Originally 05/23/2001)  . TETANUS/TDAP  07/10/2021  . PNA vac Low Risk Adult  Completed        Plan:      PCP Notes   Health Maintenance Dexa scan 2002  Declines at this time;  [facility]ogram was done in the last year   Educated regarding shingrix     Abnormal Screens  none   Referrals  none  Patient concerns; States BM has changed, passes small "balls"  Eats 3 meals a day Does not have a lot of fat except ice cream Will try prunes in the am and drink more water  Encouraged to add some fat to her diet (healthy fats as avocado and salmon which she does  Allergic to Yellow jacket bit to foot  The next time, she was stung in the finger Took benadryl but still had swelling from finger to her elbow.  Will discuss with Dr. Elease Hashimoto when she makes an apt  Will take benedryl immediately the next time and put ice on it.   Pain in left arm but is better now; spoke to Dr. Elease Hashimoto and felt it was tendonitis   Nurse Concerns; As noted  Next PCP apt Scheduled today for 2/10 /2010  Labs were completed 07/10/16; I told her to schedule this year when am apt available.       I have personally reviewed and noted the following in the patient's chart:   . Medical and social history . Use of alcohol, tobacco or illicit drugs  . Current medications and supplements . Functional ability and status . Nutritional status . Physical activity . Advanced directives . List of other physicians . Hospitalizations,  surgeries, and ER visits in previous 12 months . Vitals . Screenings to include cognitive, depression, and falls . Referrals and appointments  In addition, I have reviewed and discussed with patient certain preventive protocols, quality metrics, and best practice recommendations. A written personalized care plan for preventive services as well as general preventive health recommendations were provided to patient.     OMBTD,HRCBU, RN  01/21/2018  I have reviewed the documentation for the [facility] and [facility] provided by the health coach and agree with their documentation. I was immediately available for any questions  Eulas Post MD [facility]

## 2018-01-21 ENCOUNTER — Ambulatory Visit (INDEPENDENT_AMBULATORY_CARE_PROVIDER_SITE_OTHER): Payer: Medicare HMO

## 2018-01-21 VITALS — BP 118/60 | HR 64 | Ht 63.5 in | Wt 110.3 lb

## 2018-01-21 DIAGNOSIS — Z0000 Encounter for general adult medical examination without abnormal findings: Secondary | ICD-10-CM | POA: Diagnosis not present

## 2018-01-21 NOTE — Patient Instructions (Addendum)
Christina Oliver , Thank you for taking time to come for your Medicare Wellness Visit. I appreciate your ongoing commitment to your health goals. Please review the following plan we discussed and let me know if I can assist you in the future.   Try to make an early am apt and come in NPO, but may have water He works wed and Friday and 7am  Will discuss stool change; the reaction to bee stings with Dr. Elease Hashimoto at her apt to be schedule today   Shingrix is a vaccine for the prevention of Shingles in Adults 4 and older.  If you are on Medicare, the shingrix is covered under your Part D plan, so you will take both of the vaccines in the series at your pharmacy. Please check with your benefits regarding applicable copays or out of pocket expenses.  The Shingrix is given in 2 vaccines approx 8 weeks apart. You must receive the 2nd dose prior to 6 months from receipt of the first. Please have the pharmacist print out you Immunization  dates for our office records   May make an eye apt with dr. Raquel James or other    These are the goals we discussed: Goals    . Patient Stated     To maintain lifestyle !        This is a list of the screening recommended for you and due dates:  Health Maintenance  Topic Date Due  . Flu Shot  01/16/2018  . DEXA scan (bone density measurement)  01/22/2019*  . Tetanus Vaccine  07/10/2021  . Pneumonia vaccines  Completed  *Topic was postponed. The date shown is not the original due date.      Fall Prevention in the Home Falls can cause injuries. They can happen to people of all ages. There are many things you can do to make your home safe and to help prevent falls. What can I do on the outside of my home?  Regularly fix the edges of walkways and driveways and fix any cracks.  Remove anything that might make you trip as you walk through a door, such as a raised step or threshold.  Trim any bushes or trees on the path to your home.  Use bright outdoor  lighting.  Clear any walking paths of anything that might make someone trip, such as rocks or tools.  Regularly check to see if handrails are loose or broken. Make sure that both sides of any steps have handrails.  Any raised decks and porches should have guardrails on the edges.  Have any leaves, snow, or ice cleared regularly.  Use sand or salt on walking paths during winter.  Clean up any spills in your garage right away. This includes oil or grease spills. What can I do in the bathroom?  Use night lights.  Install grab bars by the toilet and in the tub and shower. Do not use towel bars as grab bars.  Use non-skid mats or decals in the tub or shower.  If you need to sit down in the shower, use a plastic, non-slip stool.  Keep the floor dry. Clean up any water that spills on the floor as soon as it happens.  Remove soap buildup in the tub or shower regularly.  Attach bath mats securely with double-sided non-slip rug tape.  Do not have throw rugs and other things on the floor that can make you trip. What can I do in the bedroom?  Use night  lights.  Make sure that you have a light by your bed that is easy to reach.  Do not use any sheets or blankets that are too big for your bed. They should not hang down onto the floor.  Have a firm chair that has side arms. You can use this for support while you get dressed.  Do not have throw rugs and other things on the floor that can make you trip. What can I do in the kitchen?  Clean up any spills right away.  Avoid walking on wet floors.  Keep items that you use a lot in easy-to-reach places.  If you need to reach something above you, use a strong step stool that has a grab bar.  Keep electrical cords out of the way.  Do not use floor polish or wax that makes floors slippery. If you must use wax, use non-skid floor wax.  Do not have throw rugs and other things on the floor that can make you trip. What can I do with my  stairs?  Do not leave any items on the stairs.  Make sure that there are handrails on both sides of the stairs and use them. Fix handrails that are broken or loose. Make sure that handrails are as long as the stairways.  Check any carpeting to make sure that it is firmly attached to the stairs. Fix any carpet that is loose or worn.  Avoid having throw rugs at the top or bottom of the stairs. If you do have throw rugs, attach them to the floor with carpet tape.  Make sure that you have a light switch at the top of the stairs and the bottom of the stairs. If you do not have them, ask someone to add them for you. What else can I do to help prevent falls?  Wear shoes that: ? Do not have high heels. ? Have rubber bottoms. ? Are comfortable and fit you well. ? Are closed at the toe. Do not wear sandals.  If you use a stepladder: ? Make sure that it is fully opened. Do not climb a closed stepladder. ? Make sure that both sides of the stepladder are locked into place. ? Ask someone to hold it for you, if possible.  Clearly mark and make sure that you can see: ? Any grab bars or handrails. ? First and last steps. ? Where the edge of each step is.  Use tools that help you move around (mobility aids) if they are needed. These include: ? Canes. ? Walkers. ? Scooters. ? Crutches.  Turn on the lights when you go into a dark area. Replace any light bulbs as soon as they burn out.  Set up your furniture so you have a clear path. Avoid moving your furniture around.  If any of your floors are uneven, fix them.  If there are any pets around you, be aware of where they are.  Review your medicines with your doctor. Some medicines can make you feel dizzy. This can increase your chance of falling. Ask your doctor what other things that you can do to help prevent falls. This information is not intended to replace advice given to you by your health care provider. Make sure you discuss any  questions you have with your health care provider. Document Released: 03/31/2009 Document Revised: 11/10/2015 Document Reviewed: 07/09/2014 Elsevier Interactive Patient Education  2018 [place] Maintenance, Female Adopting a healthy lifestyle and getting preventive care can go  a long way to promote health and wellness. Talk with your health care provider about what schedule of regular examinations is right for you. This is a good chance for you to check in with your provider about disease prevention and staying healthy. In between checkups, there are plenty of things you can do on your own. Experts have done a lot of research about which lifestyle changes and preventive measures are most likely to keep you healthy. Ask your health care provider for more information. Weight and diet Eat a healthy diet  Be sure to include plenty of vegetables, fruits, low-fat dairy products, and lean protein.  Do not eat a lot of foods high in solid fats, added sugars, or salt.  Get regular exercise. This is one of the most important things you can do for your health. ? Most adults should exercise for at least 150 minutes each week. The exercise should increase your heart rate and make you sweat (moderate-intensity exercise). ? Most adults should also do strengthening exercises at least twice a week. This is in addition to the moderate-intensity exercise.  Maintain a healthy weight  Body mass index (BMI) is a measurement that can be used to identify possible weight problems. It estimates body fat based on height and weight. Your health care provider can help determine your BMI and help you achieve or maintain a healthy weight.  For females 7 years of age and older: ? A BMI below 18.5 is considered underweight. ? A BMI of 18.5 to 24.9 is normal. ? A BMI of 25 to 29.9 is considered overweight. ? A BMI of 30 and above is considered obese.  Watch levels of cholesterol and blood lipids  You  should start having your blood tested for lipids and cholesterol at 82 years of age, then have this test every 5 years.  You may need to have your cholesterol levels checked more often if: ? Your lipid or cholesterol levels are high. ? You are older than 82 years of age. ? You are at high risk for heart disease.  Cancer screening Lung Cancer  Lung cancer screening is recommended for adults 22-70 years old who are at high risk for lung cancer because of a history of smoking.  A yearly low-dose CT scan of the lungs is recommended for people who: ? Currently smoke. ? Have quit within the past 15 years. ? Have at least a 30-pack-year history of smoking. A pack year is smoking an average of one pack of cigarettes a day for 1 year.  Yearly screening should continue until it has been 15 years since you quit.  Yearly screening should stop if you develop a health problem that would prevent you from having lung cancer treatment.  Breast Cancer  Practice breast self-awareness. This means understanding how your breasts normally appear and feel.  It also means doing regular breast self-exams. Let your health care provider know about any changes, no matter how small.  If you are in your 20s or 30s, you should have a clinical breast exam (CBE) by a health care provider every 1-3 years as part of a regular health exam.  If you are 22 or older, have a CBE every year. Also consider having a breast X-ray (mammogram) every year.  If you have a family history of breast cancer, talk to your health care provider about genetic screening.  If you are at high risk for breast cancer, talk to your health care provider about having an MRI  and a mammogram every year.  Breast cancer gene (BRCA) assessment is recommended for women who have family members with BRCA-related cancers. BRCA-related cancers include: ? Breast. ? Ovarian. ? Tubal. ? Peritoneal cancers.  Results of the assessment will determine the  need for genetic counseling and BRCA1 and BRCA2 testing.  Cervical Cancer Your health care provider may recommend that you be screened regularly for cancer of the pelvic organs (ovaries, uterus, and vagina). This screening involves a pelvic examination, including checking for microscopic changes to the surface of your cervix (Pap test). You may be encouraged to have this screening done every 3 years, beginning at age 55.  For women ages 73-65, health care providers may recommend pelvic exams and Pap testing every 3 years, or they may recommend the Pap and pelvic exam, combined with testing for human papilloma virus (HPV), every 5 years. Some types of HPV increase your risk of cervical cancer. Testing for HPV may also be done on women of any age with unclear Pap test results.  Other health care providers may not recommend any screening for nonpregnant women who are considered low risk for pelvic cancer and who do not have symptoms. Ask your health care provider if a screening pelvic exam is right for you.  If you have had past treatment for cervical cancer or a condition that could lead to cancer, you need Pap tests and screening for cancer for at least 20 years after your treatment. If Pap tests have been discontinued, your risk factors (such as having a new sexual partner) need to be reassessed to determine if screening should resume. Some women have medical problems that increase the chance of getting cervical cancer. In these cases, your health care provider may recommend more frequent screening and Pap tests.  Colorectal Cancer  This type of cancer can be detected and often prevented.  Routine colorectal cancer screening usually begins at 82 years of age and continues through 82 years of age.  Your health care provider may recommend screening at an earlier age if you have risk factors for colon cancer.  Your health care provider may also recommend using home test kits to check for hidden blood  in the stool.  A small camera at the end of a tube can be used to examine your colon directly (sigmoidoscopy or colonoscopy). This is done to check for the earliest forms of colorectal cancer.  Routine screening usually begins at age 68.  Direct examination of the colon should be repeated every 5-10 years through 82 years of age. However, you may need to be screened more often if early forms of precancerous polyps or small growths are found.  Skin Cancer  Check your skin from head to toe regularly.  Tell your health care provider about any new moles or changes in moles, especially if there is a change in a mole's shape or color.  Also tell your health care provider if you have a mole that is larger than the size of a pencil eraser.  Always use sunscreen. Apply sunscreen liberally and repeatedly throughout the day.  Protect yourself by wearing long sleeves, pants, a wide-brimmed hat, and sunglasses whenever you are outside.  Heart disease, diabetes, and high blood pressure  High blood pressure causes heart disease and increases the risk of stroke. High blood pressure is more likely to develop in: ? People who have blood pressure in the high end of the normal range (130-139/85-89 mm Hg). ? People who are overweight or obese. ?  People who are African American.  If you are 60-4 years of age, have your blood pressure checked every 3-5 years. If you are 68 years of age or older, have your blood pressure checked every year. You should have your blood pressure measured twice-once when you are at a hospital or clinic, and once when you are not at a hospital or clinic. Record the average of the two measurements. To check your blood pressure when you are not at a hospital or clinic, you can use: ? An automated blood pressure machine at a pharmacy. ? A home blood pressure monitor.  If you are between 49 years and 43 years old, ask your health care provider if you should take aspirin to prevent  strokes.  Have regular diabetes screenings. This involves taking a blood sample to check your fasting blood sugar level. ? If you are at a normal weight and have a low risk for diabetes, have this test once every three years after 82 years of age. ? If you are overweight and have a high risk for diabetes, consider being tested at a younger age or more often. Preventing infection Hepatitis B  If you have a higher risk for hepatitis B, you should be screened for this virus. You are considered at high risk for hepatitis B if: ? You were born in a country where hepatitis B is common. Ask your health care provider which countries are considered high risk. ? Your parents were born in a high-risk country, and you have not been immunized against hepatitis B (hepatitis B vaccine). ? You have HIV or AIDS. ? You use needles to inject street drugs. ? You live with someone who has hepatitis B. ? You have had sex with someone who has hepatitis B. ? You get hemodialysis treatment. ? You take certain medicines for conditions, including cancer, organ transplantation, and autoimmune conditions.  Hepatitis C  Blood testing is recommended for: ? Everyone born from [birth year] through 1963. ? Anyone with known risk factors for hepatitis C.  Sexually transmitted infections (STIs)  You should be screened for sexually transmitted infections (STIs) including gonorrhea and chlamydia if: ? You are sexually active and are younger than 82 years of age. ? You are older than 82 years of age and your health care provider tells you that you are at risk for this type of infection. ? Your sexual activity has changed since you were last screened and you are at an increased risk for chlamydia or gonorrhea. Ask your health care provider if you are at risk.  If you do not have HIV, but are at risk, it may be recommended that you take a prescription medicine daily to prevent HIV infection. This is called pre-exposure prophylaxis  (PrEP). You are considered at risk if: ? You are sexually active and do not regularly use condoms or know the HIV status of your partner(s). ? You take drugs by injection. ? You are sexually active with a partner who has HIV.  Talk with your health care provider about whether you are at high risk of being infected with HIV. If you choose to begin PrEP, you should first be tested for HIV. You should then be tested every 3 months for as long as you are taking PrEP. Pregnancy  If you are premenopausal and you may become pregnant, ask your health care provider about preconception counseling.  If you may become pregnant, take 400 to 800 micrograms (mcg) of folic acid every day.  If  you want to prevent pregnancy, talk to your health care provider about birth control (contraception). Osteoporosis and menopause  Osteoporosis is a disease in which the bones lose minerals and strength with aging. This can result in serious bone fractures. Your risk for osteoporosis can be identified using a bone density scan.  If you are 58 years of age or older, or if you are at risk for osteoporosis and fractures, ask your health care provider if you should be screened.  Ask your health care provider whether you should take a calcium or vitamin D supplement to lower your risk for osteoporosis.  Menopause may have certain physical symptoms and risks.  Hormone replacement therapy may reduce some of these symptoms and risks. Talk to your health care provider about whether hormone replacement therapy is right for you. Follow these instructions at home:  Schedule regular health, dental, and eye exams.  Stay current with your immunizations.  Do not use any tobacco products including cigarettes, chewing tobacco, or electronic cigarettes.  If you are pregnant, do not drink alcohol.  If you are breastfeeding, limit how much and how often you drink alcohol.  Limit alcohol intake to no more than 1 drink per day for  nonpregnant women. One drink equals 12 ounces of beer, 5 ounces of wine, or 1 ounces of hard liquor.  Do not use street drugs.  Do not share needles.  Ask your health care provider for help if you need support or information about quitting drugs.  Tell your health care provider if you often feel depressed.  Tell your health care provider if you have ever been abused or do not feel safe at home. This information is not intended to replace advice given to you by your health care provider. Make sure you discuss any questions you have with your health care provider. Document Released: 12/18/2010 Document Revised: 11/10/2015 Document Reviewed: 03/08/2015 Elsevier Interactive Patient Education  [employer].

## 2018-02-12 DIAGNOSIS — R69 Illness, unspecified: Secondary | ICD-10-CM | POA: Diagnosis not present

## 2018-03-17 ENCOUNTER — Encounter: Payer: Self-pay | Admitting: Family Medicine

## 2018-03-24 ENCOUNTER — Encounter: Payer: Self-pay | Admitting: Family Medicine

## 2018-04-10 DIAGNOSIS — R69 Illness, unspecified: Secondary | ICD-10-CM | POA: Diagnosis not present

## 2018-05-06 DIAGNOSIS — H2513 Age-related nuclear cataract, bilateral: Secondary | ICD-10-CM | POA: Diagnosis not present

## 2018-05-22 DIAGNOSIS — R69 Illness, unspecified: Secondary | ICD-10-CM | POA: Diagnosis not present

## 2018-07-18 ENCOUNTER — Encounter: Payer: Self-pay | Admitting: Family Medicine

## 2018-07-28 ENCOUNTER — Other Ambulatory Visit: Payer: Self-pay

## 2018-07-28 ENCOUNTER — Ambulatory Visit (INDEPENDENT_AMBULATORY_CARE_PROVIDER_SITE_OTHER): Payer: Medicare HMO | Admitting: Family Medicine

## 2018-07-28 ENCOUNTER — Encounter: Payer: Self-pay | Admitting: Family Medicine

## 2018-07-28 VITALS — BP 110/66 | HR 65 | Temp 97.8°F | Ht 63.0 in | Wt 111.2 lb

## 2018-07-28 DIAGNOSIS — Z0000 Encounter for general adult medical examination without abnormal findings: Secondary | ICD-10-CM | POA: Diagnosis not present

## 2018-07-28 LAB — CBC WITH DIFFERENTIAL/PLATELET
BASOS ABS: 0 10*3/uL (ref 0.0–0.1)
Basophils Relative: 0.9 % (ref 0.0–3.0)
EOS ABS: 0.1 10*3/uL (ref 0.0–0.7)
Eosinophils Relative: 2.3 % (ref 0.0–5.0)
HCT: 35.7 % — ABNORMAL LOW (ref 36.0–46.0)
Hemoglobin: 11.9 g/dL — ABNORMAL LOW (ref 12.0–15.0)
LYMPHS ABS: 1.4 10*3/uL (ref 0.7–4.0)
LYMPHS PCT: 34.1 % (ref 12.0–46.0)
MCHC: 33.4 g/dL (ref 30.0–36.0)
MCV: 85.8 fl (ref 78.0–100.0)
MONO ABS: 0.4 10*3/uL (ref 0.1–1.0)
Monocytes Relative: 9 % (ref 3.0–12.0)
NEUTROS ABS: 2.3 10*3/uL (ref 1.4–7.7)
NEUTROS PCT: 53.7 % (ref 43.0–77.0)
Platelets: 226 10*3/uL (ref 150.0–400.0)
RBC: 4.16 Mil/uL (ref 3.87–5.11)
RDW: 13.8 % (ref 11.5–15.5)
WBC: 4.2 10*3/uL (ref 4.0–10.5)

## 2018-07-28 LAB — BASIC METABOLIC PANEL
BUN: 18 mg/dL (ref 6–23)
CHLORIDE: 106 meq/L (ref 96–112)
CO2: 27 mEq/L (ref 19–32)
CREATININE: 0.72 mg/dL (ref 0.40–1.20)
Calcium: 9.3 mg/dL (ref 8.4–10.5)
GFR: 77.51 mL/min (ref >60.00)
Glucose, Bld: 78 mg/dL (ref 70–99)
Potassium: 4.5 mEq/L (ref 3.5–5.1)
Sodium: 140 mEq/L (ref 135–145)

## 2018-07-28 LAB — LIPID PANEL
CHOL/HDL RATIO: 4
Cholesterol: 260 mg/dL — ABNORMAL HIGH (ref 0–200)
HDL: 72.3 mg/dL (ref >39.00)
LDL CALC: 176 mg/dL — AB (ref 0–99)
NonHDL: 187.92
TRIGLYCERIDES: 58 mg/dL (ref 0.0–149.0)
VLDL: 11.6 mg/dL (ref 0.0–40.0)

## 2018-07-28 LAB — HEPATIC FUNCTION PANEL
ALT: 15 U/L (ref 0–35)
AST: 21 U/L (ref 0–37)
Albumin: 4.3 g/dL (ref 3.5–5.2)
Alkaline Phosphatase: 73 U/L (ref 39–117)
BILIRUBIN DIRECT: 0.1 mg/dL (ref 0.0–0.3)
BILIRUBIN TOTAL: 0.9 mg/dL (ref 0.2–1.2)
Total Protein: 6.5 g/dL (ref 6.0–8.3)

## 2018-07-28 LAB — TSH: TSH: 0.92 u[IU]/mL (ref 0.35–4.50)

## 2018-07-28 NOTE — Progress Notes (Signed)
Subjective:     Patient ID: Christina Oliver, female   DOB: Sep 22, 1933, 85 y.o.   MRN: [id number]  HPI Patient is seen for complete physical.  She has history of GERD.  She had been taking regular Zantac but tapered herself off of all medications several months ago and has had no recent breakthrough GERD symptoms.  She has history of high cholesterol but overall fairly low risk for CAD.  She has chronic normocytic anemia.  Generally feels well.  No recent falls.  She spends much of her time caring for her husband who has several health needs  Never smoked.  She had a mammogram couple years ago.  She is undecided whether she will pursue further mammograms.  She has decided against DEXA scans.  Immunizations up-to-date  Past Medical History:  Diagnosis Date  . CONTUSION, LEG 01/30/2010  . SCIATICA, ACUTE 10/05/2008   Past Surgical History:  Procedure Laterality Date  . VEIN SURGERY     varicose, right leg    reports that she has never smoked. She has never used smokeless tobacco. She reports current alcohol use. She reports that she does not use drugs. family history includes Cancer in her son. Allergies  Allergen Reactions  . Sulfadiazine     As a child     Review of Systems  Constitutional: Negative for activity change, appetite change, fatigue, fever and unexpected weight change.  HENT: Negative for ear pain, hearing loss, sore throat and trouble swallowing.   Eyes: Negative for visual disturbance.  Respiratory: Negative for cough and shortness of breath.   Cardiovascular: Negative for chest pain and palpitations.  Gastrointestinal: Negative for abdominal pain, blood in stool, constipation and diarrhea.  Endocrine: Negative for polydipsia and polyuria.  Genitourinary: Negative for dysuria and hematuria.  Musculoskeletal: Negative for arthralgias, back pain and myalgias.  Skin: Negative for rash.  Neurological: Negative for dizziness, syncope and headaches.  Hematological:  Negative for adenopathy.  Psychiatric/Behavioral: Negative for confusion and dysphoric mood.       Objective:   Physical Exam Constitutional:      Appearance: She is well-developed.  HENT:     Head: Normocephalic and atraumatic.  Eyes:     Pupils: Pupils are equal, round, and reactive to light.  Neck:     Musculoskeletal: Normal range of motion and neck supple.     Thyroid: No thyromegaly.  Cardiovascular:     Rate and Rhythm: Normal rate and regular rhythm.     Heart sounds: Normal heart sounds. No murmur.  Pulmonary:     Effort: No respiratory distress.     Breath sounds: Normal breath sounds. No wheezing or rales.  Abdominal:     General: Bowel sounds are normal. There is no distension.     Palpations: Abdomen is soft. There is no mass.     Tenderness: There is no abdominal tenderness. There is no guarding or rebound.  Musculoskeletal: Normal range of motion.  Lymphadenopathy:     Cervical: No cervical adenopathy.  Skin:    Findings: No rash.  Neurological:     Mental Status: She is alert and oriented to person, place, and time.     Cranial Nerves: No cranial nerve deficit.     Deep Tendon Reflexes: Reflexes normal.  Psychiatric:        Behavior: Behavior normal.        Thought Content: Thought content normal.        Judgment: Judgment normal.  Assessment:     Physical exam.  Generally healthy 85 year old female.  She has past history of GERD which is currently stable off medications    Plan:     -Discussed importance of regular weightbearing exercise -Offered repeat mammograms but at this point she is undecided -Check follow-up labs -Continue with yearly flu vaccine  Eulas Post MD [facility]

## 2019-01-26 ENCOUNTER — Ambulatory Visit: Payer: Medicare HMO

## 2019-02-18 ENCOUNTER — Encounter: Payer: Self-pay | Admitting: Family Medicine

## 2019-02-18 ENCOUNTER — Other Ambulatory Visit: Payer: Self-pay

## 2019-02-18 ENCOUNTER — Telehealth (INDEPENDENT_AMBULATORY_CARE_PROVIDER_SITE_OTHER): Payer: Medicare HMO | Admitting: Family Medicine

## 2019-02-18 DIAGNOSIS — R0789 Other chest pain: Secondary | ICD-10-CM | POA: Diagnosis not present

## 2019-02-18 NOTE — Progress Notes (Signed)
This visit type was conducted due to national recommendations for restrictions regarding the COVID-19 pandemic in an effort to limit this patient's exposure and mitigate transmission in our community.   Virtual Visit via Video Note  I connected with Christina Oliver on [id number] at  4:00 PM EDT by a video enabled telemedicine application and verified that I am speaking with the correct person using two identifiers.  Location patient: home Location provider:work or home office Persons participating in the virtual visit: patient, provider  I discussed the limitations of evaluation and management by telemedicine and the availability of in person appointments. The patient expressed understanding and agreed to proceed.   HPI: Patient called with complaint of "pain under her left breast ".  She states that she has had a couple episodes over the past few months including recently where she had very sharp but very transient pain lasting just a couple seconds.  This was just under the left breast region.  Most recent 1 occurred at night at rest.  She has not had a recent cough.  No fevers, chills, pleuritic pain.  No radiation of pain.  She stays very active and mows her yard which is 15 miles total of mowing per week with a push mower and has never had any pain with activity.  She has not noted any skin rashes.  No abdominal pain.  No appetite or weight changes.   ROS: See pertinent positives and negatives per HPI.  Past Medical History:  Diagnosis Date  . CONTUSION, LEG 01/30/2010  . SCIATICA, ACUTE 10/05/2008    Past Surgical History:  Procedure Laterality Date  . VEIN SURGERY     varicose, right leg    Family History  Problem Relation Age of Onset  . Cancer Son        leukemia    SOCIAL HX: Non-smoker  No current outpatient medications on file.  EXAM:  VITALS per patient if applicable:  GENERAL: alert, oriented, appears well and in no acute distress  HEENT: atraumatic,  conjunttiva clear, no obvious abnormalities on inspection of external nose and ears  NECK: normal movements of the head and neck  LUNGS: on inspection no signs of respiratory distress, breathing rate appears normal, no obvious gross SOB, gasping or wheezing  CV: no obvious cyanosis  MS: moves all visible extremities without noticeable abnormality  PSYCH/NEURO: pleasant and cooperative, no obvious depression or anxiety, speech and thought processing grossly intact  ASSESSMENT AND PLAN:  Discussed the following assessment and plan:  Atypical chest pain.  She describes couple episodes of very fleeting sharp pain which lasted just a few seconds.  Suspect either neuropathic or gaseous type discomfort.  She engages in regular physical activity with no recent chest pain  -Recommend observation for now.  Follow-up promptly for any progressive pain, pleuritic pain, dyspnea, or other concerns    I discussed the assessment and treatment plan with the patient. The patient was provided an opportunity to ask questions and all were answered. The patient agreed with the plan and demonstrated an understanding of the instructions.   The patient was advised to call back or seek an in-person evaluation if the symptoms worsen or if the condition fails to improve as anticipated.   Carolann Littler, MD

## 2019-03-31 DIAGNOSIS — R69 Illness, unspecified: Secondary | ICD-10-CM | POA: Diagnosis not present

## 2019-04-16 DIAGNOSIS — L309 Dermatitis, unspecified: Secondary | ICD-10-CM | POA: Diagnosis not present

## 2019-05-21 DIAGNOSIS — Z882 Allergy status to sulfonamides status: Secondary | ICD-10-CM | POA: Diagnosis not present

## 2019-05-21 DIAGNOSIS — Z809 Family history of malignant neoplasm, unspecified: Secondary | ICD-10-CM | POA: Diagnosis not present

## 2019-08-03 ENCOUNTER — Encounter: Payer: Medicare HMO | Admitting: Family Medicine

## 2019-09-10 ENCOUNTER — Other Ambulatory Visit: Payer: Self-pay

## 2019-09-11 ENCOUNTER — Ambulatory Visit (INDEPENDENT_AMBULATORY_CARE_PROVIDER_SITE_OTHER): Payer: Medicare HMO | Admitting: Family Medicine

## 2019-09-11 VITALS — BP 118/68 | HR 73 | Temp 98.1°F | Ht 63.0 in | Wt 111.2 lb

## 2019-09-11 DIAGNOSIS — K219 Gastro-esophageal reflux disease without esophagitis: Secondary | ICD-10-CM

## 2019-09-11 DIAGNOSIS — Z0000 Encounter for general adult medical examination without abnormal findings: Secondary | ICD-10-CM

## 2019-09-11 DIAGNOSIS — M720 Palmar fascial fibromatosis [Dupuytren]: Secondary | ICD-10-CM | POA: Diagnosis not present

## 2019-09-11 DIAGNOSIS — D1721 Benign lipomatous neoplasm of skin and subcutaneous tissue of right arm: Secondary | ICD-10-CM

## 2019-09-11 DIAGNOSIS — D649 Anemia, unspecified: Secondary | ICD-10-CM | POA: Diagnosis not present

## 2019-09-11 DIAGNOSIS — M25532 Pain in left wrist: Secondary | ICD-10-CM | POA: Diagnosis not present

## 2019-09-11 LAB — LIPID PANEL
Cholesterol: 242 mg/dL — ABNORMAL HIGH (ref 0–200)
HDL: 59.6 mg/dL (ref >39.00)
LDL Cholesterol: 161 mg/dL — ABNORMAL HIGH (ref 0–99)
NonHDL: 181.9
Total CHOL/HDL Ratio: 4
Triglycerides: 107 mg/dL (ref 0.0–149.0)
VLDL: 21.4 mg/dL (ref 0.0–40.0)

## 2019-09-11 LAB — HEPATIC FUNCTION PANEL
ALT: 10 U/L (ref 0–35)
AST: 16 U/L (ref 0–37)
Albumin: 4.1 g/dL (ref 3.5–5.2)
Alkaline Phosphatase: 82 U/L (ref 39–117)
Bilirubin, Direct: 0.1 mg/dL (ref 0.0–0.3)
Total Bilirubin: 0.7 mg/dL (ref 0.2–1.2)
Total Protein: 6.2 g/dL (ref 6.0–8.3)

## 2019-09-11 LAB — CBC WITH DIFFERENTIAL/PLATELET
Basophils Absolute: 0 10*3/uL (ref 0.0–0.1)
Basophils Relative: 0.8 % (ref 0.0–3.0)
Eosinophils Absolute: 0.1 10*3/uL (ref 0.0–0.7)
Eosinophils Relative: 2.3 % (ref 0.0–5.0)
HCT: 32.3 % — ABNORMAL LOW (ref 36.0–46.0)
Hemoglobin: 10.7 g/dL — ABNORMAL LOW (ref 12.0–15.0)
Lymphocytes Relative: 32.6 % (ref 12.0–46.0)
Lymphs Abs: 1.6 10*3/uL (ref 0.7–4.0)
MCHC: 33.1 g/dL (ref 30.0–36.0)
MCV: 86.2 fl (ref 78.0–100.0)
Monocytes Absolute: 0.4 10*3/uL (ref 0.1–1.0)
Monocytes Relative: 8.9 % (ref 3.0–12.0)
Neutro Abs: 2.8 10*3/uL (ref 1.4–7.7)
Neutrophils Relative %: 55.4 % (ref 43.0–77.0)
Platelets: 236 10*3/uL (ref 150.0–400.0)
RBC: 3.75 Mil/uL — ABNORMAL LOW (ref 3.87–5.11)
RDW: 13.3 % (ref 11.5–15.5)
WBC: 5 10*3/uL (ref 4.0–10.5)

## 2019-09-11 LAB — BASIC METABOLIC PANEL
BUN: 19 mg/dL (ref 6–23)
CO2: 28 mEq/L (ref 19–32)
Calcium: 8.9 mg/dL (ref 8.4–10.5)
Chloride: 108 mEq/L (ref 96–112)
Creatinine, Ser: 0.77 mg/dL (ref 0.40–1.20)
GFR: 71.54 mL/min (ref >60.00)
Glucose, Bld: 85 mg/dL (ref 70–99)
Potassium: 4.4 mEq/L (ref 3.5–5.1)
Sodium: 141 mEq/L (ref 135–145)

## 2019-09-11 LAB — TSH: TSH: 0.51 u[IU]/mL (ref 0.35–4.50)

## 2019-09-11 NOTE — Patient Instructions (Signed)
Lipoma  A lipoma is a noncancerous (benign) tumor that is made up of fat cells. This is a very common type of soft-tissue growth. Lipomas are usually found under the skin (subcutaneous). They may occur in any tissue of the body that contains fat. Common areas for lipomas to appear include the back, arms, shoulders, buttocks, and thighs. Lipomas grow slowly, and they are usually painless. Most lipomas do not cause problems and do not require treatment. What are the causes? The cause of this condition is not known. What increases the risk? You are more likely to develop this condition if:  You are 26-12 years old.  You have a family history of lipomas. What are the signs or symptoms? A lipoma usually appears as a small, round bump under the skin. In most cases, the lump will:  Feel soft or rubbery.  Not cause pain or other symptoms. However, if a lipoma is located in an area where it pushes on nerves, it can become painful or cause other symptoms. How is this diagnosed? A lipoma can usually be diagnosed with a physical exam. You may also have tests to confirm the diagnosis and to rule out other conditions. Tests may include:  Imaging tests, such as a CT scan or an MRI.  Removal of a tissue sample to be looked at under a microscope (biopsy). How is this treated? Treatment for this condition depends on the size of the lipoma and whether it is causing any symptoms.  For small lipomas that are not causing problems, no treatment is needed.  If a lipoma is bigger or it causes problems, surgery may be done to remove the lipoma. Lipomas can also be removed to improve appearance. Most often, the procedure is done after applying a medicine that numbs the area (local anesthetic).  Liposuction may be done to reduce the size of the lipoma before it is removed through surgery, or it may be done to remove the lipoma. Lipomas are removed with this method in order to limit incision size and scarring. A  liposuction tube is inserted through a small incision into the lipoma, and the contents of the lipoma are removed through the tube with suction. Follow these instructions at home:  Watch your lipoma for any changes.  Keep all follow-up visits as told by your health care provider. This is important. Contact a health care provider if:  Your lipoma becomes larger or hard.  Your lipoma becomes painful, red, or increasingly swollen. These could be signs of infection or a more serious condition. Get help right away if:  You develop tingling or numbness in an area near the lipoma. This could indicate that the lipoma is causing nerve damage. Summary  A lipoma is a noncancerous tumor that is made up of fat cells.  Most lipomas do not cause problems and do not require treatment.  If a lipoma is bigger or it causes problems, surgery may be done to remove the lipoma.  Contact a health care provider if your lipoma becomes larger or hard, or if it becomes painful, red, or increasingly swollen. Pain, redness, and swelling could be signs of infection or a more serious condition. This information is not intended to replace advice given to you by your health care provider. Make sure you discuss any questions you have with your health care provider. Document Revised: 01/19/2019 Document Reviewed: 01/19/2019 Elsevier Patient Education  [place]. Dupuytren's Contracture Dupuytren's contracture is a condition in which tissue under the skin of  the palm becomes thick. This causes one or more of the fingers to curl inward (contract) toward the palm. After a while, the fingers may not be able to straighten out. This condition affects some or all of the fingers and the palm of the hand. This condition may affect one or both hands. Dupuytren's contracture is a long-term (chronic) condition that develops (progresses) slowly over time. There is no cure, but symptoms can be managed and progression can be  slowed with treatment. This condition is usually not dangerous or painful, but it can interfere with everyday tasks. What are the causes?  This condition is caused by tissue (fascia) in the palm that gets thicker and tighter. When the fascia thickens, it pulls on the cords of tissue (tendons) that control finger movement. This causes the fingers to contract. The cause of fascia thickening is not known. However, the condition is often passed along from parent to child (inherited). What increases the risk? The following factors may make you more likely to develop this condition:  Being 30 years of age or older.  Being female.  Having a family history of this condition.  Using tobacco products, including cigarettes, chewing tobacco, and e-cigarettes.  Drinking alcohol excessively.  Having diabetes.  Having a seizure disorder. What are the signs or symptoms? Early symptoms of this condition may include:  Thick, puckered skin on the hand.  One or more lumps (nodules) on the palm. Nodules may be tender when they first appear, but they are generally painless. Later symptoms of this condition may include:  Thick cords of tissue in the palm.  Fingers curled up toward the palm.  Inability to straighten the fingers into their normal position. Though this condition is usually painless, you may have discomfort when holding or grabbing objects. How is this diagnosed? This condition is diagnosed with a physical exam, which may include:  Looking at your hands and feeling your palms. This is to check for thickened fascia and nodules.  Measuring finger motion.  Doing the [place] tabletop test. You may be asked to try to put your hand on a surface, with your palm down and your fingers straight out. How is this treated? There is no cure for this condition, but treatment can relieve discomfort and make symptoms more manageable. Treatment options may include:  Physical therapy. This can  strengthen your hand and increase flexibility.  Occupational therapy. This can help you with everyday tasks that may be more difficult because of your condition.  Shots (injections). Substances may be injected into your hand, such as: ? Medicines that help to decrease swelling (corticosteroids). ? Proteins (collagenase) to weaken thick tissue. After a collagenase injection, your health care provider may stretch your fingers.  Needle aponeurotomy. A needle is pushed through the skin and into the fascia. Moving the needle against the fascia can weaken or break up the thick tissue.  Surgery. This may be needed if your condition causes discomfort or interferes with everyday activities. Physical therapy is usually needed after surgery. No treatment is guaranteed to cure this condition. Recurrence of symptoms is common. Follow these instructions at home: Hand care  Take these actions to help protect your hand from possible injury: ? Use tools that have padded grips. ? Wear protective gloves while you work with your hands. ? Avoid repetitive hand movements. General instructions  Take over-the-counter and prescription medicines only as told by your health care provider.  Manage any other conditions that you have, such as diabetes.  If physical therapy was prescribed, do exercises as told by your health care provider.  Do not use any products that contain nicotine or tobacco, such as cigarettes, e-cigarettes, and chewing tobacco. If you need help quitting, ask your health care provider.  If you drink alcohol: ? Limit how much you use to:  0-1 drink a day for women.  0-2 drinks a day for men. ? Be aware of how much alcohol is in your drink. In the [place] one drink equals one 12 oz bottle of beer (355 mL), one 5 oz glass of wine (148 mL), or one 1 oz glass of hard liquor (44 mL).  Keep all follow-up visits as told by your health care provider. This is important. Contact a health care  provider if:  You develop new symptoms, or your symptoms get worse.  You have pain that gets worse or does not get better with medicine.  You have difficulty or discomfort with everyday tasks.  You develop numbness or tingling. Get help right away if:  You have severe pain.  Your fingers change color or become unusually cold. Summary  Dupuytren's contracture is a condition in which tissue under the skin of the palm becomes thick.  This condition is caused by tissue (fascia) that thickens. When it thickens, it pulls on the cords of tissue (tendons) that control finger movement and makes the fingers to contract.  You are more likely to develop this condition if you are a man, are over 77 years of age, have a family history of the condition, and drink a lot of alcohol.  This condition can be treated with physical and occupational therapy, injections, and surgery.  Follow instructions about how to care for your hand. Get help right away if you have severe pain or your fingers change color or become cold. This information is not intended to replace advice given to you by your health care provider. Make sure you discuss any questions you have with your health care provider. Document Revised: 12/24/2017 Document Reviewed: 12/24/2017 Elsevier Patient Education  [place].

## 2019-09-11 NOTE — Progress Notes (Signed)
Subjective:     Patient ID: Christina Oliver, female   DOB: 1933/12/27, 86 y.o.   MRN: [id number]  HPI Christina Oliver is seen for physical exam and to have several other items addressed as below.  She has no significant chronic medical problems.  She takes no regular medications.  She has not had a mammogram in years.  She has not had shingles vaccine and is undecided at this point.  She has completed her Covid vaccinations.  She had history of GERD in the past and was able to taper off medication.  She has been able to control this with elimination of caffeine, coffee, and citric things like pineapple.  She has small mass right arm laterally.  Nonpainful.  Noted several months ago.  Not growing rapidly in size.  She has some fleeting sharp pain left wrist volar surface.  Denies any numbness or weakness of the hand.  She does a lot of typing.  She has noticed some thickening in the palm of both hands are on the fourth digit.  Does not have any flexion contractures at the MCP joint.  She continues to care for her debilitated husband.  He requires 24/7 help.  Past Medical History:  Diagnosis Date  . CONTUSION, LEG 01/30/2010  . SCIATICA, ACUTE 10/05/2008   Past Surgical History:  Procedure Laterality Date  . VEIN SURGERY     varicose, right leg    reports that she has never smoked. She has never used smokeless tobacco. She reports current alcohol use. She reports that she does not use drugs. family history includes Cancer in her son. Allergies  Allergen Reactions  . Sulfadiazine     As a child     Review of Systems  Constitutional: Negative for activity change, appetite change, fatigue, fever and unexpected weight change.  HENT: Negative for ear pain, hearing loss, sore throat and trouble swallowing.   Eyes: Negative for visual disturbance.  Respiratory: Negative for cough and shortness of breath.   Cardiovascular: Negative for chest pain and palpitations.  Gastrointestinal:  Negative for abdominal pain, blood in stool, constipation and diarrhea.  Genitourinary: Negative for dysuria and hematuria.  Musculoskeletal: Negative for arthralgias, back pain and myalgias.  Skin: Negative for rash.  Neurological: Negative for dizziness, syncope and headaches.  Hematological: Negative for adenopathy.  Psychiatric/Behavioral: Negative for confusion and dysphoric mood.       Objective:   Physical Exam Constitutional:      Appearance: She is well-developed.  HENT:     Head: Normocephalic and atraumatic.  Eyes:     Pupils: Pupils are equal, round, and reactive to light.  Neck:     Thyroid: No thyromegaly.  Cardiovascular:     Rate and Rhythm: Normal rate and regular rhythm.     Heart sounds: Normal heart sounds. No murmur.  Pulmonary:     Effort: No respiratory distress.     Breath sounds: Normal breath sounds. No wheezing or rales.  Abdominal:     General: Bowel sounds are normal. There is no distension.     Palpations: Abdomen is soft. There is no mass.     Tenderness: There is no abdominal tenderness. There is no guarding or rebound.  Musculoskeletal:        General: Normal range of motion.     Cervical back: Normal range of motion and neck supple.     Comments: She has small approximately 1-1/2 x 1/2 cm fatty consistency mass right arm consistent with lipoma  She has some thickening and stenosis of the palm along the ring finger bilaterally consistent with Dupuytren's contracture.  Full range of motion all digits of the hand  Lymphadenopathy:     Cervical: No cervical adenopathy.  Skin:    Findings: No rash.  Neurological:     Mental Status: She is alert and oriented to person, place, and time.     Cranial Nerves: No cranial nerve deficit.     Motor: No weakness.     Deep Tendon Reflexes: Reflexes normal.  Psychiatric:        Behavior: Behavior normal.        Thought Content: Thought content normal.        Judgment: Judgment normal.         Assessment:     #1 physical exam.  We discussed the following health maintenance issues as below  #2 benign-appearing lipoma right arm  #3 Dupuytren's contractures of both hands  #4 history of GERD controlled with lifestyle modification  #5 sharp fleeting pains volar surface left wrist.  Suspect she may have some carpal tunnel.  She is not any associated weakness or numbness.  Increased typing which is a risk factor    Plan:     -We discussed mammography and she will consider whether she will do this later in the year  -We discussed shingles vaccine and she will consider and check at pharmacy if interested  -Reassurance regarding her lipoma.  Her Dupuytren's contractures are minimally painful and not causing any functional impairment and will observe for now  -Consider nighttime use of wrist splint for left wrist if pain persist  -Obtain some screening lab work per her request  Eulas Post MD [facility]

## 2019-09-14 NOTE — Addendum Note (Signed)
Addended by: Agnes Lawrence on: 09/14/2019 10:08 AM   Modules accepted: Orders

## 2019-09-22 ENCOUNTER — Other Ambulatory Visit (INDEPENDENT_AMBULATORY_CARE_PROVIDER_SITE_OTHER): Payer: Medicare HMO

## 2019-09-22 ENCOUNTER — Other Ambulatory Visit: Payer: Self-pay

## 2019-09-22 DIAGNOSIS — D649 Anemia, unspecified: Secondary | ICD-10-CM

## 2019-09-22 DIAGNOSIS — E538 Deficiency of other specified B group vitamins: Secondary | ICD-10-CM

## 2019-09-22 LAB — IBC + FERRITIN
Ferritin: 66.4 ng/mL (ref 10.0–291.0)
Iron: 72 ug/dL (ref 42–145)
Saturation Ratios: 23.6 % (ref 20.0–50.0)
Transferrin: 218 mg/dL (ref 212.0–360.0)

## 2019-09-22 LAB — VITAMIN B12: Vitamin B-12: 181 pg/mL — ABNORMAL LOW (ref 211–911)

## 2019-09-28 ENCOUNTER — Ambulatory Visit: Payer: Medicare HMO

## 2019-09-29 ENCOUNTER — Ambulatory Visit: Payer: Medicare HMO

## 2019-10-02 ENCOUNTER — Other Ambulatory Visit: Payer: Self-pay

## 2019-10-02 ENCOUNTER — Ambulatory Visit (INDEPENDENT_AMBULATORY_CARE_PROVIDER_SITE_OTHER): Payer: Medicare HMO | Admitting: *Deleted

## 2019-10-02 DIAGNOSIS — E538 Deficiency of other specified B group vitamins: Secondary | ICD-10-CM | POA: Diagnosis not present

## 2019-10-02 MED ORDER — CYANOCOBALAMIN 1000 MCG/ML IJ SOLN
1000.0000 ug | Freq: Once | INTRAMUSCULAR | Status: AC
  Administered 2019-10-02: 11:00:00 1000 ug via INTRAMUSCULAR

## 2019-10-02 NOTE — Progress Notes (Signed)
Per orders of Dr. Burchette, injection of B12 given by Sanaia Jasso. Patient tolerated injection well. 

## 2019-10-09 ENCOUNTER — Ambulatory Visit (INDEPENDENT_AMBULATORY_CARE_PROVIDER_SITE_OTHER): Payer: Medicare HMO

## 2019-10-09 ENCOUNTER — Other Ambulatory Visit: Payer: Self-pay

## 2019-10-09 DIAGNOSIS — E538 Deficiency of other specified B group vitamins: Secondary | ICD-10-CM

## 2019-10-09 MED ORDER — CYANOCOBALAMIN 1000 MCG/ML IJ SOLN
1000.0000 ug | Freq: Once | INTRAMUSCULAR | Status: AC
  Administered 2019-10-09: 1000 ug via INTRAMUSCULAR

## 2019-10-09 NOTE — Progress Notes (Addendum)
Per orders of Dr. Elease Hashimoto, injection of B12 given by Rebecca Eaton. Patient tolerated injection well.  Agree with injection as given  Eulas Post MD [facility]

## 2019-10-16 ENCOUNTER — Other Ambulatory Visit: Payer: Self-pay

## 2019-10-16 ENCOUNTER — Ambulatory Visit (INDEPENDENT_AMBULATORY_CARE_PROVIDER_SITE_OTHER): Payer: Medicare HMO

## 2019-10-16 DIAGNOSIS — E538 Deficiency of other specified B group vitamins: Secondary | ICD-10-CM

## 2019-10-16 MED ORDER — CYANOCOBALAMIN 1000 MCG/ML IJ SOLN
1000.0000 ug | Freq: Once | INTRAMUSCULAR | Status: AC
  Administered 2019-10-16: 11:00:00 1000 ug via INTRAMUSCULAR

## 2019-10-16 NOTE — Progress Notes (Signed)
Per orders of , injection of B12 given in   deltoid by Franco Collet. Patient tolerated injection well.

## 2019-10-16 NOTE — Patient Instructions (Signed)
Health Maintenance Due  Topic Date Due  . COVID-19 Vaccine (1) Never done  . DEXA SCAN  Never done    Depression screen [facility] 2/9 09/11/2019 07/28/2018 01/21/2018  Decreased Interest 0 0 0  Down, Depressed, Hopeless 0 0 0  PHQ - 2 Score 0 0 0  Altered sleeping - 0 -  Tired, decreased energy - 0 -  Change in appetite - 0 -  Feeling bad or failure about yourself  - 0 -  Trouble concentrating - 0 -  Moving slowly or fidgety/restless - 0 -  Suicidal thoughts - 0 -  PHQ-9 Score - 0 -  Difficult doing work/chores - Not difficult at all -

## 2019-10-21 DIAGNOSIS — R69 Illness, unspecified: Secondary | ICD-10-CM | POA: Diagnosis not present

## 2019-10-23 ENCOUNTER — Ambulatory Visit (INDEPENDENT_AMBULATORY_CARE_PROVIDER_SITE_OTHER): Payer: Medicare HMO

## 2019-10-23 ENCOUNTER — Other Ambulatory Visit: Payer: Self-pay

## 2019-10-23 DIAGNOSIS — E538 Deficiency of other specified B group vitamins: Secondary | ICD-10-CM | POA: Diagnosis not present

## 2019-10-23 MED ORDER — CYANOCOBALAMIN 1000 MCG/ML IJ SOLN
1000.0000 ug | Freq: Once | INTRAMUSCULAR | Status: AC
  Administered 2019-10-23: 1000 ug via INTRAMUSCULAR

## 2019-10-23 NOTE — Patient Instructions (Signed)
Health Maintenance Due  Topic Date Due  . COVID-19 Vaccine (1) Never done  . DEXA SCAN  Never done    Depression screen [facility] 2/9 09/11/2019 07/28/2018 01/21/2018  Decreased Interest 0 0 0  Down, Depressed, Hopeless 0 0 0  PHQ - 2 Score 0 0 0  Altered sleeping - 0 -  Tired, decreased energy - 0 -  Change in appetite - 0 -  Feeling bad or failure about yourself  - 0 -  Trouble concentrating - 0 -  Moving slowly or fidgety/restless - 0 -  Suicidal thoughts - 0 -  PHQ-9 Score - 0 -  Difficult doing work/chores - Not difficult at all -

## 2019-10-23 NOTE — Progress Notes (Signed)
Per orders of Dr.Burchette , injection of B12 given in R deltoid by Franco Collet. Patient tolerated injection well.   Pt has completed 4/4 weekly injection. Pt advised to start on OTC 1000 mg.

## 2019-11-10 DIAGNOSIS — R69 Illness, unspecified: Secondary | ICD-10-CM | POA: Diagnosis not present

## 2020-03-02 ENCOUNTER — Ambulatory Visit (INDEPENDENT_AMBULATORY_CARE_PROVIDER_SITE_OTHER): Payer: Medicare HMO

## 2020-03-02 ENCOUNTER — Other Ambulatory Visit: Payer: Self-pay

## 2020-03-02 DIAGNOSIS — Z0000 Encounter for general adult medical examination without abnormal findings: Secondary | ICD-10-CM | POA: Diagnosis not present

## 2020-03-02 NOTE — Patient Instructions (Signed)
Christina Oliver , Thank you for taking time to come for your Medicare Wellness Visit. I appreciate your ongoing commitment to your health goals. Please review the following plan we discussed and let me know if I can assist you in the future.   Screening recommendations/referrals: Colonoscopy: No longer required  Mammogram: No longer required  Bone Density: Currently due, patient declined Recommended yearly ophthalmology/optometry visit for glaucoma screening and checkup Recommended yearly dental visit for hygiene and checkup  Vaccinations: Influenza vaccine: Currently due for 2021-2022 flu vaccine  Pneumococcal vaccine: Completed series Tdap vaccine: Up to date, next due 07/10/2021 Shingles vaccine: Currently due for Shingrix, you may contact your pharmacy to discuss cost and to receive the vaccine     Advanced directives: Please bring a copy of your medical advanced directives to your next office visit so that we may scan them into your chart.  Conditions/risks identified: None   Next appointment: 09/12/2020 @ 10:15 am with Dr. Carolann Littler for a Physical    Preventive Care 65 Years and Older, Female Preventive care refers to lifestyle choices and visits with your health care provider that can promote health and wellness. What does preventive care include?  A yearly physical exam. This is also called an annual well check.  Dental exams once or twice a year.  Routine eye exams. Ask your health care provider how often you should have your eyes checked.  Personal lifestyle choices, including:  Daily care of your teeth and gums.  Regular physical activity.  Eating a healthy diet.  Avoiding tobacco and drug use.  Limiting alcohol use.  Practicing safe sex.  Taking low-dose aspirin every day.  Taking vitamin and mineral supplements as recommended by your health care provider. What happens during an annual well check? The services and screenings done by your health care  provider during your annual well check will depend on your age, overall health, lifestyle risk factors, and family history of disease. Counseling  Your health care provider may ask you questions about your:  Alcohol use.  Tobacco use.  Drug use.  Emotional well-being.  Home and relationship well-being.  Sexual activity.  Eating habits.  History of falls.  Memory and ability to understand (cognition).  Work and work Statistician.  Reproductive health. Screening  You may have the following tests or measurements:  Height, weight, and BMI.  Blood pressure.  Lipid and cholesterol levels. These may be checked every 5 years, or more frequently if you are over 35 years old.  Skin check.  Lung cancer screening. You may have this screening every year starting at age 80 if you have a 30-pack-year history of smoking and currently smoke or have quit within the past 15 years.  Fecal occult blood test (FOBT) of the stool. You may have this test every year starting at age 63.  Flexible sigmoidoscopy or colonoscopy. You may have a sigmoidoscopy every 5 years or a colonoscopy every 10 years starting at age 67.  Hepatitis C blood test.  Hepatitis B blood test.  Sexually transmitted disease (STD) testing.  Diabetes screening. This is done by checking your blood sugar (glucose) after you have not eaten for a while (fasting). You may have this done every 1-3 years.  Bone density scan. This is done to screen for osteoporosis. You may have this done starting at age 22.  Mammogram. This may be done every 1-2 years. Talk to your health care provider about how often you should have regular mammograms. Talk with your  health care provider about your test results, treatment options, and if necessary, the need for more tests. Vaccines  Your health care provider may recommend certain vaccines, such as:  Influenza vaccine. This is recommended every year.  Tetanus, diphtheria, and acellular  pertussis (Tdap, Td) vaccine. You may need a Td booster every 10 years.  Zoster vaccine. You may need this after age 103.  Pneumococcal 13-valent conjugate (PCV13) vaccine. One dose is recommended after age 51.  Pneumococcal polysaccharide (PPSV23) vaccine. One dose is recommended after age 34. Talk to your health care provider about which screenings and vaccines you need and how often you need them. This information is not intended to replace advice given to you by your health care provider. Make sure you discuss any questions you have with your health care provider. Document Released: 07/01/2015 Document Revised: 02/22/2016 Document Reviewed: 04/05/2015 Elsevier Interactive Patient Education  2017 [employer] Prevention in the Home Falls can cause injuries. They can happen to people of all ages. There are many things you can do to make your home safe and to help prevent falls. What can I do on the outside of my home?  Regularly fix the edges of walkways and driveways and fix any cracks.  Remove anything that might make you trip as you walk through a door, such as a raised step or threshold.  Trim any bushes or trees on the path to your home.  Use bright outdoor lighting.  Clear any walking paths of anything that might make someone trip, such as rocks or tools.  Regularly check to see if handrails are loose or broken. Make sure that both sides of any steps have handrails.  Any raised decks and porches should have guardrails on the edges.  Have any leaves, snow, or ice cleared regularly.  Use sand or salt on walking paths during winter.  Clean up any spills in your garage right away. This includes oil or grease spills. What can I do in the bathroom?  Use night lights.  Install grab bars by the toilet and in the tub and shower. Do not use towel bars as grab bars.  Use non-skid mats or decals in the tub or shower.  If you need to sit down in the shower, use a plastic,  non-slip stool.  Keep the floor dry. Clean up any water that spills on the floor as soon as it happens.  Remove soap buildup in the tub or shower regularly.  Attach bath mats securely with double-sided non-slip rug tape.  Do not have throw rugs and other things on the floor that can make you trip. What can I do in the bedroom?  Use night lights.  Make sure that you have a light by your bed that is easy to reach.  Do not use any sheets or blankets that are too big for your bed. They should not hang down onto the floor.  Have a firm chair that has side arms. You can use this for support while you get dressed.  Do not have throw rugs and other things on the floor that can make you trip. What can I do in the kitchen?  Clean up any spills right away.  Avoid walking on wet floors.  Keep items that you use a lot in easy-to-reach places.  If you need to reach something above you, use a strong step stool that has a grab bar.  Keep electrical cords out of the way.  Do not use  floor polish or wax that makes floors slippery. If you must use wax, use non-skid floor wax.  Do not have throw rugs and other things on the floor that can make you trip. What can I do with my stairs?  Do not leave any items on the stairs.  Make sure that there are handrails on both sides of the stairs and use them. Fix handrails that are broken or loose. Make sure that handrails are as long as the stairways.  Check any carpeting to make sure that it is firmly attached to the stairs. Fix any carpet that is loose or worn.  Avoid having throw rugs at the top or bottom of the stairs. If you do have throw rugs, attach them to the floor with carpet tape.  Make sure that you have a light switch at the top of the stairs and the bottom of the stairs. If you do not have them, ask someone to add them for you. What else can I do to help prevent falls?  Wear shoes that:  Do not have high heels.  Have rubber  bottoms.  Are comfortable and fit you well.  Are closed at the toe. Do not wear sandals.  If you use a stepladder:  Make sure that it is fully opened. Do not climb a closed stepladder.  Make sure that both sides of the stepladder are locked into place.  Ask someone to hold it for you, if possible.  Clearly mark and make sure that you can see:  Any grab bars or handrails.  First and last steps.  Where the edge of each step is.  Use tools that help you move around (mobility aids) if they are needed. These include:  Canes.  Walkers.  Scooters.  Crutches.  Turn on the lights when you go into a dark area. Replace any light bulbs as soon as they burn out.  Set up your furniture so you have a clear path. Avoid moving your furniture around.  If any of your floors are uneven, fix them.  If there are any pets around you, be aware of where they are.  Review your medicines with your doctor. Some medicines can make you feel dizzy. This can increase your chance of falling. Ask your doctor what other things that you can do to help prevent falls. This information is not intended to replace advice given to you by your health care provider. Make sure you discuss any questions you have with your health care provider. Document Released: 03/31/2009 Document Revised: 11/10/2015 Document Reviewed: 07/09/2014 Elsevier Interactive Patient Education  2017 [employer].

## 2020-03-02 NOTE — Progress Notes (Signed)
Subjective:   Christina Oliver is a 86 y.o. female who presents for Medicare Annual (Subsequent) preventive examination.  I connected with Christina Oliver today by telephone and verified that I am speaking with the correct person using two identifiers. Location patient: home Location provider: work Persons participating in the virtual visit: patient, provider.   I discussed the limitations, risks, security and privacy concerns of performing an evaluation and management service by telephone and the availability of in person appointments. I also discussed with the patient that there may be a patient responsible charge related to this service. The patient expressed understanding and verbally consented to this telephonic visit.    Interactive audio and video telecommunications were attempted between this provider and patient, however failed, due to patient having technical difficulties OR patient did not have access to video capability.  We continued and completed visit with audio only.     Review of Systems   N/A Cardiac Risk Factors include: advanced age (>8men, >14 women)     Objective:    Today's Vitals   There is no height or weight on file to calculate BMI.  Advanced Directives 03/02/2020 01/21/2018 01/18/2017  Does Patient Have a Medical Advance Directive? Yes Yes Yes  Type of Paramedic of [place];Living will - -  Does patient want to make changes to medical advance directive? No - Patient declined - -  Copy of [place] in Chart? No - copy requested - -    Current Medications (verified) Outpatient Encounter Medications as of 03/02/2020  Medication Sig  . B Complex-Folic Acid (B COMPLEX-VITAMIN B12 PO) Take by mouth.   No facility-administered encounter medications on file as of 03/02/2020.    Allergies (verified) Sulfadiazine   History: Past Medical History:  Diagnosis Date  . CONTUSION, LEG 01/30/2010  . SCIATICA, ACUTE  10/05/2008   Past Surgical History:  Procedure Laterality Date  . VEIN SURGERY     varicose, right leg   Family History  Problem Relation Age of Onset  . Cancer Son        leukemia   Social History   Socioeconomic History  . Marital status: Married    Spouse name: Not on file  . Number of children: Not on file  . Years of education: Not on file  . Highest education level: Not on file  Occupational History  . Not on file  Tobacco Use  . Smoking status: Never Smoker  . Smokeless tobacco: Never Used  Vaping Use  . Vaping Use: Never used  Substance and Sexual Activity  . Alcohol use: Yes  . Drug use: No  . Sexual activity: Not on file  Other Topics Concern  . Not on file  Social History Narrative  . Not on file   Social Determinants of Health   Financial Resource Strain: Low Risk   . Difficulty of Paying Living Expenses: Not hard at all  Food Insecurity: No Food Insecurity  . Worried About Charity fundraiser in the Last Year: Never true  . Ran Out of Food in the Last Year: Never true  Transportation Needs: No Transportation Needs  . Lack of Transportation (Medical): No  . Lack of Transportation (Non-Medical): No  Physical Activity: Inactive  . Days of Exercise per Week: 0 days  . Minutes of Exercise per Session: 0 min  Stress: No Stress Concern Present  . Feeling of Stress : Not at all  Social Connections: Moderately Integrated  .  Frequency of Communication with Friends and Family: More than three times a week  . Frequency of Social Gatherings with Friends and Family: More than three times a week  . Attends Religious Services: Never  . Active Member of Clubs or Organizations: Yes  . Attends Archivist Meetings: More than 4 times per year  . Marital Status: Married    Tobacco Counseling Counseling given: Not Answered   Clinical Intake:  Pre-visit preparation completed: Yes  Pain : No/denies pain     Nutritional Risks: Unintentional weight  loss (Patient states she has loss 20 lbs in the past 3 years and weighs only 102 lbs) Diabetes: No  How often do you need to have someone help you when you read instructions, pamphlets, or other written materials from your doctor or pharmacy?: 1 - Never What is the last grade level you completed in school?: PHD  Diabetic?No  Interpreter Needed?: No  Information entered by :: [place] of Daily Living In your present state of health, do you have any difficulty performing the following activities: 03/02/2020  Hearing? N  Vision? N  Difficulty concentrating or making decisions? N  Walking or climbing stairs? N  Dressing or bathing? N  Doing errands, shopping? N  Preparing Food and eating ? N  Using the Toilet? N  In the past six months, have you accidently leaked urine? N  Do you have problems with loss of bowel control? N  Managing your Medications? N  Managing your Finances? N  Housekeeping or managing your Housekeeping? N  Some recent data might be hidden    Patient Care Team: Eulas Post, MD as PCP - General  Indicate any recent Medical Services you may have received from other than [facility] providers in the past year (date may be approximate).     Assessment:   This is a routine wellness examination for Christina Oliver.  Hearing/Vision screen  Hearing Screening   125Hz  250Hz  500Hz  1000Hz  2000Hz  3000Hz  4000Hz  6000Hz  8000Hz   Right ear:           Left ear:           Vision Screening Comments: Patient states usually gets eye exam yearly has missed due to COVID   Dietary issues and exercise activities discussed: Current Exercise Habits: The patient has a physically strenuous job, but has no regular exercise apart from work.  Goals    . patient      Will develop a plan for self Biking, hiking. Whatever you can do outdoor w friends    . Patient Stated     To maintain lifestyle !       Depression Screen PHQ 2/9 Scores 03/02/2020 09/11/2019 07/28/2018 01/21/2018  01/18/2017 07/15/2015 07/15/2015  PHQ - 2 Score 0 0 0 0 0 0 0  PHQ- 9 Score 0 - 0 - - - -    Fall Risk Fall Risk  03/02/2020 09/11/2019 01/21/2018 01/18/2017 07/15/2015  Falls in the past year? 0 0 No No No  Number falls in past yr: 0 0 - - -  Injury with Fall? 0 0 - - -  Risk for fall due to : No Fall Risks - - - -  Follow up Falls evaluation completed;Falls prevention discussed Falls evaluation completed - - -    Any stairs in or around the home? Yes  If so, are there any without handrails? No  Home free of loose throw rugs in walkways, pet beds, electrical cords, etc? Yes  Adequate lighting in your home to reduce risk of falls? Yes   ASSISTIVE DEVICES UTILIZED TO PREVENT FALLS:  Life alert? No  Use of a cane, walker or w/c? No  Grab bars in the bathroom? No  Shower chair or bench in shower? Yes  Elevated toilet seat or a handicapped toilet? No     Cognitive Function: Cognitive screening not indicated based on direct observation MMSE - Mini Mental State Exam 01/21/2018  Not completed: (No Data)     6CIT Screen 01/18/2017  What Year? 0 points  What month? 0 points  What time? 0 points  Count back from 20 0 points  Months in reverse 0 points  Repeat phrase 0 points  Total Score 0    Immunizations Immunization History  Administered Date(s) Administered  . Influenza Split 05/11/2011, 04/11/2012  . Influenza Whole 06/29/2010  . Influenza, High Dose Seasonal PF 04/10/2018, 03/31/2019  . Influenza,inj,Quad PF,6+ Mos 04/08/2014  . Influenza-Unspecified 03/18/2013, 04/19/2015, 03/18/2016, 02/04/2017  . PFIZER SARS-COV-2 Vaccination 08/11/2019, 09/01/2019  . Pneumococcal Conjugate-13 07/15/2015  . Pneumococcal Polysaccharide-23 06/26/2010  . Td 06/18/2002  . Tdap 07/11/2011    TDAP status: Up to date Flu Vaccine status: Up to date Pneumococcal vaccine status: Up to date Covid-19 vaccine status: Completed vaccines  Qualifies for Shingles Vaccine? Yes   Zostavax completed No     Shingrix Completed?: No.    Education has been provided regarding the importance of this vaccine. Patient has been advised to call insurance company to determine out of pocket expense if they have not yet received this vaccine. Advised may also receive vaccine at local pharmacy or Health Dept. Verbalized acceptance and understanding.  Screening Tests Health Maintenance  Topic Date Due  . INFLUENZA VACCINE  01/17/2020  . TETANUS/TDAP  07/10/2021  . COVID-19 Vaccine  Completed  . PNA vac Low Risk Adult  Completed  . DEXA SCAN  Discontinued    Health Maintenance  Health Maintenance Due  Topic Date Due  . INFLUENZA VACCINE  01/17/2020    Colorectal cancer screening: No longer required.  Mammogram status: No longer required.  Bone Density Status: Patient declined  Lung Cancer Screening: (Low Dose CT Chest recommended if Age 55-80 years, 30 pack-year currently smoking OR have quit w/in 15years.) does not qualify.   Lung Cancer Screening Referral: N/A  Additional Screening:  Hepatitis C Screening: does not qualify;   Vision Screening: Recommended annual ophthalmology exams for early detection of glaucoma and other disorders of the eye. Is the patient up to date with their annual eye exam?  Yes  Who is the provider or what is the name of the office in which the patient attends annual eye exams? Dr.Burchette If pt is not established with a provider, would they like to be referred to a provider to establish care? No .   Dental Screening: Recommended annual dental exams for proper oral hygiene  Community Resource Referral / Chronic Care Management: CRR required this visit?  No   CCM required this visit?  No      Plan:     I have personally reviewed and noted the following in the patient's chart:   . Medical and social history . Use of alcohol, tobacco or illicit drugs  . Current medications and supplements . Functional ability and status . Nutritional status . Physical  activity . Advanced directives . List of other physicians . Hospitalizations, surgeries, and ER visits in previous 12 months . Vitals . Screenings to include cognitive,  depression, and falls . Referrals and appointments  In addition, I have reviewed and discussed with patient certain preventive protocols, quality metrics, and best practice recommendations. A written personalized care plan for preventive services as well as general preventive health recommendations were provided to patient.     Ofilia Neas, LPN   [id number]   Nurse Notes: None

## 2020-03-04 ENCOUNTER — Ambulatory Visit: Payer: Medicare HMO | Admitting: Family Medicine

## 2020-04-23 DIAGNOSIS — R030 Elevated blood-pressure reading, without diagnosis of hypertension: Secondary | ICD-10-CM | POA: Diagnosis not present

## 2020-04-23 DIAGNOSIS — Z882 Allergy status to sulfonamides status: Secondary | ICD-10-CM | POA: Diagnosis not present

## 2020-04-28 DIAGNOSIS — R69 Illness, unspecified: Secondary | ICD-10-CM | POA: Diagnosis not present

## 2020-09-12 ENCOUNTER — Encounter: Payer: Self-pay | Admitting: Family Medicine

## 2020-09-12 ENCOUNTER — Other Ambulatory Visit: Payer: Self-pay

## 2020-09-12 ENCOUNTER — Ambulatory Visit (INDEPENDENT_AMBULATORY_CARE_PROVIDER_SITE_OTHER): Payer: Medicare HMO | Admitting: Family Medicine

## 2020-09-12 VITALS — BP 132/60 | HR 77 | Temp 97.9°F | Ht 63.0 in | Wt 113.3 lb

## 2020-09-12 DIAGNOSIS — Z0000 Encounter for general adult medical examination without abnormal findings: Secondary | ICD-10-CM | POA: Diagnosis not present

## 2020-09-12 DIAGNOSIS — R600 Localized edema: Secondary | ICD-10-CM | POA: Diagnosis not present

## 2020-09-12 LAB — CBC WITH DIFFERENTIAL/PLATELET
Basophils Absolute: 0 10*3/uL (ref 0.0–0.1)
Basophils Relative: 1 % (ref 0.0–3.0)
Eosinophils Absolute: 0.2 10*3/uL (ref 0.0–0.7)
Eosinophils Relative: 4.1 % (ref 0.0–5.0)
HCT: 33.3 % — ABNORMAL LOW (ref 36.0–46.0)
Hemoglobin: 11.1 g/dL — ABNORMAL LOW (ref 12.0–15.0)
Lymphocytes Relative: 34.1 % (ref 12.0–46.0)
Lymphs Abs: 1.6 10*3/uL (ref 0.7–4.0)
MCHC: 33.4 g/dL (ref 30.0–36.0)
MCV: 85 fl (ref 78.0–100.0)
Monocytes Absolute: 0.3 10*3/uL (ref 0.1–1.0)
Monocytes Relative: 7 % (ref 3.0–12.0)
Neutro Abs: 2.5 10*3/uL (ref 1.4–7.7)
Neutrophils Relative %: 53.8 % (ref 43.0–77.0)
Platelets: 234 10*3/uL (ref 150.0–400.0)
RBC: 3.91 Mil/uL (ref 3.87–5.11)
RDW: 13.4 % (ref 11.5–15.5)
WBC: 4.7 10*3/uL (ref 4.0–10.5)

## 2020-09-12 LAB — BASIC METABOLIC PANEL
BUN: 20 mg/dL (ref 6–23)
CO2: 28 mEq/L (ref 19–32)
Calcium: 9.2 mg/dL (ref 8.4–10.5)
Chloride: 108 mEq/L (ref 96–112)
Creatinine, Ser: 0.72 mg/dL (ref 0.40–1.20)
GFR: 76.84 mL/min (ref >60.00)
Glucose, Bld: 106 mg/dL — ABNORMAL HIGH (ref 70–99)
Potassium: 4.3 mEq/L (ref 3.5–5.1)
Sodium: 141 mEq/L (ref 135–145)

## 2020-09-12 LAB — MICROALBUMIN / CREATININE URINE RATIO
Creatinine,U: 51.8 mg/dL
Microalb Creat Ratio: 1.7 mg/g (ref 0.0–30.0)
Microalb, Ur: 0.9 mg/dL (ref 0.0–1.9)

## 2020-09-12 LAB — HEPATIC FUNCTION PANEL
ALT: 13 U/L (ref 0–35)
AST: 18 U/L (ref 0–37)
Albumin: 4.2 g/dL (ref 3.5–5.2)
Alkaline Phosphatase: 75 U/L (ref 39–117)
Bilirubin, Direct: 0.1 mg/dL (ref 0.0–0.3)
Total Bilirubin: 0.8 mg/dL (ref 0.2–1.2)
Total Protein: 6.5 g/dL (ref 6.0–8.3)

## 2020-09-12 LAB — LIPID PANEL
Cholesterol: 274 mg/dL — ABNORMAL HIGH (ref 0–200)
HDL: 74.5 mg/dL (ref >39.00)
LDL Cholesterol: 181 mg/dL — ABNORMAL HIGH (ref 0–99)
NonHDL: 199.1
Total CHOL/HDL Ratio: 4
Triglycerides: 91 mg/dL (ref 0.0–149.0)
VLDL: 18.2 mg/dL (ref 0.0–40.0)

## 2020-09-12 LAB — TSH: TSH: 0.97 u[IU]/mL (ref 0.35–4.50)

## 2020-09-12 NOTE — Progress Notes (Signed)
Established Patient Office Visit  Subjective:  Patient ID: Christina Oliver, female    DOB: Apr 08, 1934  Age: 87 y.o. MRN: [id number]  CC:  Chief Complaint  Patient presents with  . Annual Exam    Pt has a list of concerns she would like to give to Dr. Elease Hashimoto    HPI [name] presents for physical exam.  Her husband is very debilitated and unable to ambulate and she basically is providing 24/7 coverage for him.  She does get a little bit of help from neighbor.  She does some editing work for individuals and basically does all this from her home.  Generally healthy with no significant chronic medical problems.  She has had some reflux symptoms in the past which are currently stable without medication.    She has noticed some recent mild lower extremity edema which is better when she gets up in the morning and work late in the day.  No dyspnea.  Symptoms are relatively mild.  She is also had some occasional left upper extremity pains.  They travel down toward her hand and wrist.  She thinks this may be nerve related.  Denies any neck pain currently.  She took some Tylenol which seemed to resolve this pain.  Denies any upper extremity weakness or numbness.  No chest pain  Health maintenance reviewed  -She declines DEXA scans.  She has had mammogram prior to the pandemic but is not sure she wishes to pursue further -No indication for screening colonoscopy at her age -COVID vaccines up-to-date -Pneumonia vaccines completed -She thinks she had flu vaccine last fall but is not sure -No history of Shingrix vaccine  Social history-she was at [employer]	[place] at [employer] for several years.  Never smoked.  She is married.  Husband is very physically debilitated as above  Past Medical History:  Diagnosis Date  . CONTUSION, LEG 01/30/2010  . SCIATICA, ACUTE 10/05/2008    Past Surgical History:  Procedure Laterality Date  . VEIN SURGERY     varicose, right leg    Family History   Problem Relation Age of Onset  . Cancer Son        leukemia    Social History   Socioeconomic History  . Marital status: Married    Spouse name: Not on file  . Number of children: Not on file  . Years of education: Not on file  . Highest education level: Not on file  Occupational History  . Not on file  Tobacco Use  . Smoking status: Never Smoker  . Smokeless tobacco: Never Used  Vaping Use  . Vaping Use: Never used  Substance and Sexual Activity  . Alcohol use: Yes  . Drug use: No  . Sexual activity: Not on file  Other Topics Concern  . Not on file  Social History Narrative  . Not on file   Social Determinants of Health   Financial Resource Strain: Low Risk   . Difficulty of Paying Living Expenses: Not hard at all  Food Insecurity: No Food Insecurity  . Worried About Charity fundraiser in the Last Year: Never true  . Ran Out of Food in the Last Year: Never true  Transportation Needs: No Transportation Needs  . Lack of Transportation (Medical): No  . Lack of Transportation (Non-Medical): No  Physical Activity: Inactive  . Days of Exercise per Week: 0 days  . Minutes of Exercise per Session: 0 min  Stress: No Stress Concern Present  .  Feeling of Stress : Not at all  Social Connections: Moderately Integrated  . Frequency of Communication with Friends and Family: More than three times a week  . Frequency of Social Gatherings with Friends and Family: More than three times a week  . Attends Religious Services: Never  . Active Member of Clubs or Organizations: Yes  . Attends Archivist Meetings: More than 4 times per year  . Marital Status: Married  Human resources officer Violence: Not At Risk  . Fear of Current or Ex-Partner: No  . Emotionally Abused: No  . Physically Abused: No  . Sexually Abused: No    Outpatient Medications Prior to Visit  Medication Sig Dispense Refill  . B Complex-Folic Acid (B COMPLEX-VITAMIN B12 PO) Take by mouth.    . Multiple  Vitamins-Minerals (VITAMIN D3 COMPLETE) TABS Take by mouth.    . Omega-3 Fatty Acids (FISH OIL) 1000 MG CAPS Take by mouth.     No facility-administered medications prior to visit.    Allergies  Allergen Reactions  . Sulfadiazine     As a child    ROS Review of Systems  Constitutional: Negative for activity change, appetite change, fatigue, fever and unexpected weight change.  HENT: Negative for ear pain, hearing loss, sore throat and trouble swallowing.   Eyes: Negative for visual disturbance.  Respiratory: Negative for cough and shortness of breath.   Cardiovascular: Positive for leg swelling. Negative for chest pain and palpitations.  Gastrointestinal: Negative for abdominal pain, blood in stool, constipation and diarrhea.  Genitourinary: Negative for dysuria and hematuria.  Musculoskeletal: Negative for arthralgias, back pain and myalgias.  Skin: Negative for rash.  Neurological: Negative for dizziness, syncope and headaches.  Hematological: Negative for adenopathy.  Psychiatric/Behavioral: Negative for confusion and dysphoric mood.      Objective:    Physical Exam Vitals reviewed.  Constitutional:      Appearance: Normal appearance.  Cardiovascular:     Rate and Rhythm: Normal rate and regular rhythm.  Pulmonary:     Effort: Pulmonary effort is normal.     Breath sounds: Normal breath sounds.  Abdominal:     Palpations: Abdomen is soft.     Tenderness: There is no abdominal tenderness.  Musculoskeletal:     Cervical back: Neck supple.     Comments: Only trace pitting edema ankles bilaterally  Skin:    Comments: She has a couple of scattered seborrheic keratoses on her back.  No concerning lesions.  Neurological:     General: No focal deficit present.     Mental Status: She is alert.     BP 132/60 (BP Location: Left Arm, Cuff Size: Normal)   Pulse 77   Temp 97.9 F (36.6 C) (Oral)   Ht 5\' 3"  (1.6 m)   Wt 113 lb 4.8 oz (51.4 kg)   SpO2 97%   BMI 20.07  kg/m  Wt Readings from Last 3 Encounters:  09/12/20 113 lb 4.8 oz (51.4 kg)  09/11/19 111 lb 3.2 oz (50.4 kg)  07/28/18 111 lb 3.2 oz (50.4 kg)     Health Maintenance Due  Topic Date Due  . INFLUENZA VACCINE  01/17/2020    There are no preventive care reminders to display for this patient.  Lab Results  Component Value Date   TSH 0.51 09/11/2019   Lab Results  Component Value Date   WBC 5.0 09/11/2019   HGB 10.7 (L) 09/11/2019   HCT 32.3 (L) 09/11/2019   MCV 86.2 09/11/2019  PLT 236.0 09/11/2019   Lab Results  Component Value Date   NA 141 09/11/2019   K 4.4 09/11/2019   CO2 28 09/11/2019   GLUCOSE 85 09/11/2019   BUN 19 09/11/2019   CREATININE 0.77 09/11/2019   BILITOT 0.7 09/11/2019   ALKPHOS 82 09/11/2019   AST 16 09/11/2019   ALT 10 09/11/2019   PROT 6.2 09/11/2019   ALBUMIN 4.1 09/11/2019   CALCIUM 8.9 09/11/2019   GFR 71.54 09/11/2019   Lab Results  Component Value Date   CHOL 242 (H) 09/11/2019   Lab Results  Component Value Date   HDL 59.60 09/11/2019   Lab Results  Component Value Date   LDLCALC 161 (H) 09/11/2019   Lab Results  Component Value Date   TRIG 107.0 09/11/2019   Lab Results  Component Value Date   CHOLHDL 4 09/11/2019   No results found for: HGBA1C    Assessment & Plan:   Problem List Items Addressed This Visit   None   Visit Diagnoses    Physical exam    -  Primary   Relevant Orders   Basic metabolic panel   Lipid panel   CBC with Differential/Platelet   TSH   Hepatic function panel   Microalbumin / creatinine urine ratio   Bilateral leg edema       Relevant Orders   TSH   Microalbumin / creatinine urine ratio    She does have some mild bilateral leg edema which is worse late in the day.  Does not have any concerning symptoms such as dyspnea.  Weight is up only 2 pounds from last year. -We recommend elevation of legs during the day. -Check some labs including TSH, basic chemistries, urine  microalbumin -Avoid diuretics unless symptoms worsen  -We discussed consideration for Shingrix vaccine.  She will consider -She is undecided whether she will get further mammograms -Initial blood pressure was up but repeat after rest was improved and at goal.  She will continue to monitor this at home.  No orders of the defined types were placed in this encounter.   Follow-up: No follow-ups on file.    Carolann Littler, MD

## 2020-09-12 NOTE — Patient Instructions (Signed)
Peripheral Edema  Peripheral edema is swelling that is caused by a buildup of fluid. Peripheral edema most often affects the lower legs, ankles, and feet. It can also develop in the arms, hands, and face. The area of the body that has peripheral edema will look swollen. It may also feel heavy or warm. Your clothes may start to feel tight. Pressing on the area may make a temporary dent in your skin. You may not be able to move your swollen arm or leg as much as usual. There are many causes of peripheral edema. It can happen because of a complication of other conditions such as congestive heart failure, kidney disease, or a problem with your blood circulation. It also can be a side effect of certain medicines or because of an infection. It often happens to women during pregnancy. Sometimes, the cause is not known. Follow these instructions at home: Managing pain, stiffness, and swelling  Raise (elevate) your legs while you are sitting or lying down.  Move around often to prevent stiffness and to lessen swelling.  Do not sit or stand for long periods of time.  Wear support stockings as told by your health care provider.   Medicines  Take over-the-counter and prescription medicines only as told by your health care provider.  Your health care provider may prescribe medicine to help your body get rid of excess water (diuretic). General instructions  Pay attention to any changes in your symptoms.  Follow instructions from your health care provider about limiting salt (sodium) in your diet. Sometimes, eating less salt may reduce swelling.  Moisturize skin daily to help prevent skin from cracking and draining.  Keep all follow-up visits as told by your health care provider. This is important. Contact a health care provider if you have:  A fever.  Edema that starts suddenly or is getting worse, especially if you are pregnant or have a medical condition.  Swelling in only one leg.  Increased  swelling, redness, or pain in one or both of your legs.  Drainage or sores at the area where you have edema. Get help right away if you:  Develop shortness of breath, especially when you are lying down.  Have pain in your chest or abdomen.  Feel weak.  Feel faint. Summary  Peripheral edema is swelling that is caused by a buildup of fluid. Peripheral edema most often affects the lower legs, ankles, and feet.  Move around often to prevent stiffness and to lessen swelling. Do not sit or stand for long periods of time.  Pay attention to any changes in your symptoms.  Contact a health care provider if you have edema that starts suddenly or is getting worse, especially if you are pregnant or have a medical condition.  Get help right away if you develop shortness of breath, especially when lying down. This information is not intended to replace advice given to you by your health care provider. Make sure you discuss any questions you have with your health care provider. Document Revised: 02/26/2018 Document Reviewed: 02/26/2018 Elsevier Patient Education  2021 [employer].

## 2021-01-31 ENCOUNTER — Telehealth: Payer: Self-pay | Admitting: Family Medicine

## 2021-01-31 NOTE — Telephone Encounter (Signed)
Left message for patient to call back and schedule Medicare Annual Wellness Visit (AWV) either virtually or in office.  Left both  my jabber number [phone] and office number    Last AWV 03/02/20  please schedule at anytime with [facility] Nurse Health Advisor 1 or 2  [employer] ins can do awv calendar year  This should be a 45 minute visit.

## 2021-02-15 ENCOUNTER — Ambulatory Visit: Payer: Medicare HMO

## 2021-03-07 ENCOUNTER — Ambulatory Visit (INDEPENDENT_AMBULATORY_CARE_PROVIDER_SITE_OTHER): Payer: Medicare HMO

## 2021-03-07 ENCOUNTER — Other Ambulatory Visit: Payer: Self-pay

## 2021-03-07 VITALS — BP 100/60 | HR 70 | Temp 97.3°F | Wt 104.9 lb

## 2021-03-07 DIAGNOSIS — Z0000 Encounter for general adult medical examination without abnormal findings: Secondary | ICD-10-CM

## 2021-03-07 NOTE — Patient Instructions (Addendum)
[name] , Thank you for taking time to come for your Medicare Wellness Visit. I appreciate your ongoing commitment to your health goals. Please review the following plan we discussed and let me know if I can assist you in the future.   Screening recommendations/referrals: Colonoscopy: No longer required  Mammogram: Done 07/26/17 repeat every year Recommended yearly ophthalmology/optometry visit for glaucoma screening and checkup Recommended yearly dental visit for hygiene and checkup  Vaccinations: Influenza vaccine: Due Pneumococcal vaccine: Completed  Tdap vaccine: Done 07/11/11  Shingles vaccine: Shingrix discussed. Please contact your pharmacy for coverage information.    Covid-19:Completed 2/23, 3/16, & 1018/21, 10/06/20  Advanced directives: Please bring a copy of your health care power of attorney and living will to the office at your convenience.  Conditions/risks identified: gain weight   Next appointment: Follow up in one year for your annual wellness visit    Preventive Care 65 Years and Older, Female Preventive care refers to lifestyle choices and visits with your health care provider that can promote health and wellness. What does preventive care include? A yearly physical exam. This is also called an annual well check. Dental exams once or twice a year. Routine eye exams. Ask your health care provider how often you should have your eyes checked. Personal lifestyle choices, including: Daily care of your teeth and gums. Regular physical activity. Eating a healthy diet. Avoiding tobacco and drug use. Limiting alcohol use. Practicing safe sex. Taking low-dose aspirin every day. Taking vitamin and mineral supplements as recommended by your health care provider. What happens during an annual well check? The services and screenings done by your health care provider during your annual well check will depend on your age, overall health, lifestyle risk factors, and family  history of disease. Counseling  Your health care provider may ask you questions about your: Alcohol use. Tobacco use. Drug use. Emotional well-being. Home and relationship well-being. Sexual activity. Eating habits. History of falls. Memory and ability to understand (cognition). Work and work Statistician. Reproductive health. Screening  You may have the following tests or measurements: Height, weight, and BMI. Blood pressure. Lipid and cholesterol levels. These may be checked every 5 years, or more frequently if you are over 33 years old. Skin check. Lung cancer screening. You may have this screening every year starting at age 69 if you have a 30-pack-year history of smoking and currently smoke or have quit within the past 15 years. Fecal occult blood test (FOBT) of the stool. You may have this test every year starting at age 47. Flexible sigmoidoscopy or colonoscopy. You may have a sigmoidoscopy every 5 years or a colonoscopy every 10 years starting at age 42. Hepatitis C blood test. Hepatitis B blood test. Sexually transmitted disease (STD) testing. Diabetes screening. This is done by checking your blood sugar (glucose) after you have not eaten for a while (fasting). You may have this done every 1-3 years. Bone density scan. This is done to screen for osteoporosis. You may have this done starting at age 67. Mammogram. This may be done every 1-2 years. Talk to your health care provider about how often you should have regular mammograms. Talk with your health care provider about your test results, treatment options, and if necessary, the need for more tests. Vaccines  Your health care provider may recommend certain vaccines, such as: Influenza vaccine. This is recommended every year. Tetanus, diphtheria, and acellular pertussis (Tdap, Td) vaccine. You may need a Td booster every 10 years. Zoster vaccine.  You may need this after age 52. Pneumococcal 13-valent conjugate (PCV13)  vaccine. One dose is recommended after age 61. Pneumococcal polysaccharide (PPSV23) vaccine. One dose is recommended after age 47. Talk to your health care provider about which screenings and vaccines you need and how often you need them. This information is not intended to replace advice given to you by your health care provider. Make sure you discuss any questions you have with your health care provider. Document Released: 07/01/2015 Document Revised: 02/22/2016 Document Reviewed: 04/05/2015 Elsevier Interactive Patient Education  2017 [employer] Prevention in the Home Falls can cause injuries. They can happen to people of all ages. There are many things you can do to make your home safe and to help prevent falls. What can I do on the outside of my home? Regularly fix the edges of walkways and driveways and fix any cracks. Remove anything that might make you trip as you walk through a door, such as a raised step or threshold. Trim any bushes or trees on the path to your home. Use bright outdoor lighting. Clear any walking paths of anything that might make someone trip, such as rocks or tools. Regularly check to see if handrails are loose or broken. Make sure that both sides of any steps have handrails. Any raised decks and porches should have guardrails on the edges. Have any leaves, snow, or ice cleared regularly. Use sand or salt on walking paths during winter. Clean up any spills in your garage right away. This includes oil or grease spills. What can I do in the bathroom? Use night lights. Install grab bars by the toilet and in the tub and shower. Do not use towel bars as grab bars. Use non-skid mats or decals in the tub or shower. If you need to sit down in the shower, use a plastic, non-slip stool. Keep the floor dry. Clean up any water that spills on the floor as soon as it happens. Remove soap buildup in the tub or shower regularly. Attach bath mats securely with  double-sided non-slip rug tape. Do not have throw rugs and other things on the floor that can make you trip. What can I do in the bedroom? Use night lights. Make sure that you have a light by your bed that is easy to reach. Do not use any sheets or blankets that are too big for your bed. They should not hang down onto the floor. Have a firm chair that has side arms. You can use this for support while you get dressed. Do not have throw rugs and other things on the floor that can make you trip. What can I do in the kitchen? Clean up any spills right away. Avoid walking on wet floors. Keep items that you use a lot in easy-to-reach places. If you need to reach something above you, use a strong step stool that has a grab bar. Keep electrical cords out of the way. Do not use floor polish or wax that makes floors slippery. If you must use wax, use non-skid floor wax. Do not have throw rugs and other things on the floor that can make you trip. What can I do with my stairs? Do not leave any items on the stairs. Make sure that there are handrails on both sides of the stairs and use them. Fix handrails that are broken or loose. Make sure that handrails are as long as the stairways. Check any carpeting to make sure that it is  firmly attached to the stairs. Fix any carpet that is loose or worn. Avoid having throw rugs at the top or bottom of the stairs. If you do have throw rugs, attach them to the floor with carpet tape. Make sure that you have a light switch at the top of the stairs and the bottom of the stairs. If you do not have them, ask someone to add them for you. What else can I do to help prevent falls? Wear shoes that: Do not have high heels. Have rubber bottoms. Are comfortable and fit you well. Are closed at the toe. Do not wear sandals. If you use a stepladder: Make sure that it is fully opened. Do not climb a closed stepladder. Make sure that both sides of the stepladder are locked  into place. Ask someone to hold it for you, if possible. Clearly mark and make sure that you can see: Any grab bars or handrails. First and last steps. Where the edge of each step is. Use tools that help you move around (mobility aids) if they are needed. These include: Canes. Walkers. Scooters. Crutches. Turn on the lights when you go into a dark area. Replace any light bulbs as soon as they burn out. Set up your furniture so you have a clear path. Avoid moving your furniture around. If any of your floors are uneven, fix them. If there are any pets around you, be aware of where they are. Review your medicines with your doctor. Some medicines can make you feel dizzy. This can increase your chance of falling. Ask your doctor what other things that you can do to help prevent falls. This information is not intended to replace advice given to you by your health care provider. Make sure you discuss any questions you have with your health care provider. Document Released: 03/31/2009 Document Revised: 11/10/2015 Document Reviewed: 07/09/2014 Elsevier Interactive Patient Education  2017 [employer].

## 2021-03-07 NOTE — Progress Notes (Addendum)
Subjective:   Christina Oliver is a 87 y.o. female who presents for Medicare Annual (Subsequent) preventive examination.  Review of Systems     Cardiac Risk Factors include: advanced age (>69men, >69 women);dyslipidemia     Objective:    Today's Vitals   03/07/21 0933 03/07/21 0952  BP: 94/62 100/60  Pulse: 70   Temp: (!) 97.3 F (36.3 C)   SpO2: 98%   Weight: 104 lb 14.4 oz (47.6 kg)    Body mass index is 18.58 kg/m.  Advanced Directives 03/07/2021 03/02/2020 01/21/2018 01/18/2017  Does Patient Have a Medical Advance Directive? Yes Yes Yes Yes  Type of Paramedic of [place];Living will - -  Does patient want to make changes to medical advance directive? - No - Patient declined - -  Copy of [place] in Chart? No - copy requested No - copy requested - -    Current Medications (verified) Outpatient Encounter Medications as of 03/07/2021  Medication Sig   B Complex-Folic Acid (B COMPLEX-VITAMIN B12 PO) Take by mouth. (Patient not taking: Reported on 03/07/2021)   Multiple Vitamins-Minerals (VITAMIN D3 COMPLETE) TABS Take by mouth. (Patient not taking: Reported on 03/07/2021)   Omega-3 Fatty Acids (FISH OIL) 1000 MG CAPS Take by mouth. (Patient not taking: Reported on 03/07/2021)   PFIZER-BIONT COVID-19 VAC-TRIS SUSP injection    No facility-administered encounter medications on file as of 03/07/2021.    Allergies (verified) Sulfadiazine   History: Past Medical History:  Diagnosis Date   CONTUSION, LEG 01/30/2010   SCIATICA, ACUTE 10/05/2008   Past Surgical History:  Procedure Laterality Date   VEIN SURGERY     varicose, right leg   Family History  Problem Relation Age of Onset   Cancer Son        leukemia   Social History   Socioeconomic History   Marital status: Married    Spouse name: Not on file   Number of children: Not on file   Years of education: Not on file   Highest education  level: Not on file  Occupational History   Not on file  Tobacco Use   Smoking status: Never   Smokeless tobacco: Never  Vaping Use   Vaping Use: Never used  Substance and Sexual Activity   Alcohol use: Yes   Drug use: No   Sexual activity: Not on file  Other Topics Concern   Not on file  Social History Narrative   Not on file   Social Determinants of Health   Financial Resource Strain: Low Risk    Difficulty of Paying Living Expenses: Not hard at all  Food Insecurity: No Food Insecurity   Worried About Charity fundraiser in the Last Year: Never true   [place] in the Last Year: Never true  Transportation Needs: No Transportation Needs   Lack of Transportation (Medical): No   Lack of Transportation (Non-Medical): No  Physical Activity: Insufficiently Active   Days of Exercise per Week: 1 day   Minutes of Exercise per Session: 90 min  Stress: No Stress Concern Present   Feeling of Stress : Not at all  Social Connections: Moderately Integrated   Frequency of Communication with Friends and Family: More than three times a week   Frequency of Social Gatherings with Friends and Family: Once a week   Attends Religious Services: Never   Marine scientist or Organizations: Yes   Attends [employer]  or Organization Meetings: 1 to 4 times per year   Marital Status: Married    Tobacco Counseling Counseling given: Not Answered   Clinical Intake:  Pre-visit preparation completed: Yes  Pain : No/denies pain     BMI - recorded: 18.58 Nutritional Status: BMI <19  Underweight Nutritional Risks: None Diabetes: No  How often do you need to have someone help you when you read instructions, pamphlets, or other written materials from your doctor or pharmacy?: 1 - Never  Diabetic?no  Interpreter Needed?: No  Information entered by :: Charlott Rakes, LPM   Activities of Daily Living In your present state of health, do you have any difficulty performing the following  activities: 03/07/2021  Hearing? N  Vision? N  Difficulty concentrating or making decisions? N  Walking or climbing stairs? N  Dressing or bathing? N  Doing errands, shopping? N  Preparing Food and eating ? N  Using the Toilet? N  In the past six months, have you accidently leaked urine? N  Do you have problems with loss of bowel control? N  Managing your Medications? N  Managing your Finances? N  Housekeeping or managing your Housekeeping? N  Some recent data might be hidden    Patient Care Team: Eulas Post, MD as PCP - General  Indicate any recent Medical Services you may have received from other than [facility] providers in the past year (date may be approximate).     Assessment:   This is a routine wellness examination for Christina Oliver.  Hearing/Vision screen Hearing Screening - Comments:: Pt denies any hearing issues  Vision Screening - Comments:: Pt will follow up with Dr Avie Arenas for ey exams   Dietary issues and exercise activities discussed: Current Exercise Habits: Home exercise routine, Type of exercise: walking, Time (Minutes): > 60, Frequency (Times/Week): 1, Weekly Exercise (Minutes/Week): 0   Goals Addressed             This Visit's Progress    Patient Stated       Trying to gain weight        Depression Screen PHQ 2/9 Scores 03/07/2021 03/02/2020 09/11/2019 07/28/2018 01/21/2018 01/18/2017 07/15/2015  PHQ - 2 Score 0 0 0 0 0 0 0  PHQ- 9 Score - 0 - 0 - - -    Fall Risk Fall Risk  03/07/2021 03/02/2020 09/11/2019 01/21/2018 01/18/2017  Falls in the past year? 0 0 0 No No  Number falls in past yr: 0 0 0 - -  Injury with Fall? 0 0 0 - -  Risk for fall due to : - No Fall Risks - - -  Follow up Falls prevention discussed Falls evaluation completed;Falls prevention discussed Falls evaluation completed - -    FALL RISK PREVENTION PERTAINING TO THE HOME:  Any stairs in or around the home? Yes  If so, are there any without handrails? No  Home free of loose throw  rugs in walkways, pet beds, electrical cords, etc? Yes  Adequate lighting in your home to reduce risk of falls? Yes   ASSISTIVE DEVICES UTILIZED TO PREVENT FALLS:  Life alert? No  Use of a cane, walker or w/c? No  Grab bars in the bathroom? No  Shower chair or bench in shower? Yes  Elevated toilet seat or a handicapped toilet? No   TIMED UP AND GO:  Was the test performed? Yes .  Length of time to ambulate 10 feet: 10 sec.   Gait steady and fast with assistive  device  Cognitive Function: MMSE - Mini Mental State Exam 01/21/2018  Not completed: (No Data)     6CIT Screen 03/07/2021 01/18/2017  What Year? 0 points 0 points  What month? 0 points 0 points  What time? 0 points 0 points  Count back from 20 0 points 0 points  Months in reverse 0 points 0 points  Repeat phrase 0 points 0 points  Total Score 0 0    Immunizations Immunization History  Administered Date(s) Administered   Influenza Split 05/11/2011, 04/11/2012   Influenza Whole 06/29/2010   Influenza, High Dose Seasonal PF 04/10/2018, 03/31/2019   Influenza,inj,Quad PF,6+ Mos 04/08/2014   Influenza-Unspecified 03/18/2013, 04/19/2015, 03/18/2016, 02/04/2017   PFIZER(Purple Top)SARS-COV-2 Vaccination 08/11/2019, 09/01/2019, 04/04/2020, 09/16/2020   Pneumococcal Conjugate-13 07/15/2015   Pneumococcal Polysaccharide-23 06/26/2010   Td 06/18/2002   Tdap 07/11/2011    TDAP status: Up to date  Flu Vaccine status: Due, Education has been provided regarding the importance of this vaccine. Advised may receive this vaccine at local pharmacy or Health Dept. Aware to provide a copy of the vaccination record if obtained from local pharmacy or Health Dept. Verbalized acceptance and understanding.  Pneumococcal vaccine status: Up to date  Covid-19 vaccine status: Completed vaccines  Qualifies for Shingles Vaccine? Yes   Zostavax completed No   Shingrix Completed?: No.    Education has been provided regarding the importance of  this vaccine. Patient has been advised to call insurance company to determine out of pocket expense if they have not yet received this vaccine. Advised may also receive vaccine at local pharmacy or Health Dept. Verbalized acceptance and understanding.  Screening Tests Health Maintenance  Topic Date Due   Zoster Vaccines- Shingrix (1 of 2) Never done   INFLUENZA VACCINE  01/16/2021   TETANUS/TDAP  07/10/2021   COVID-19 Vaccine  Completed   HPV VACCINES  Aged Out   DEXA SCAN  Discontinued    Health Maintenance  Health Maintenance Due  Topic Date Due   Zoster Vaccines- Shingrix (1 of 2) Never done   INFLUENZA VACCINE  01/16/2021    Colorectal cancer screening: No longer required.   Mammogram status: Completed 07/26/17. Repeat every year      Additional Screening:   Vision Screening: Recommended annual ophthalmology exams for early detection of glaucoma and other disorders of the eye. Is the patient up to date with their annual eye exam?  Yes  Who is the provider or what is the name of the office in which the patient attends annual eye exams? Dr Blair Heys  If pt is not established with a provider, would they like to be referred to a provider to establish care? No .   Dental Screening: Recommended annual dental exams for proper oral hygiene  Community Resource Referral / Chronic Care Management: CRR required this visit?  No   CCM required this visit?  No      Plan:     I have personally reviewed and noted the following in the patient's chart:   Medical and social history Use of alcohol, tobacco or illicit drugs  Current medications and supplements including opioid prescriptions.  Functional ability and status Nutritional status Physical activity Advanced directives List of other physicians Hospitalizations, surgeries, and ER visits in previous 12 months Vitals Screenings to include cognitive, depression, and falls Referrals and appointments  In addition, I  have reviewed and discussed with patient certain preventive protocols, quality metrics, and best practice recommendations. A written personalized care plan for preventive  services as well as general preventive health recommendations were provided to patient.     Willette Brace, LPN   7/46/0029   Nurse Notes: pt will make appt to discuss weight, nutrition, swelling in the ankles/feet  and vit b 12 shot related to feeling tired.

## 2021-03-08 ENCOUNTER — Ambulatory Visit (INDEPENDENT_AMBULATORY_CARE_PROVIDER_SITE_OTHER): Payer: Medicare HMO | Admitting: Family Medicine

## 2021-03-08 ENCOUNTER — Encounter: Payer: Self-pay | Admitting: Family Medicine

## 2021-03-08 VITALS — BP 126/70 | HR 65 | Temp 97.8°F | Ht 60.0 in | Wt 104.8 lb

## 2021-03-08 DIAGNOSIS — E538 Deficiency of other specified B group vitamins: Secondary | ICD-10-CM | POA: Diagnosis not present

## 2021-03-08 DIAGNOSIS — Z23 Encounter for immunization: Secondary | ICD-10-CM

## 2021-03-08 DIAGNOSIS — R634 Abnormal weight loss: Secondary | ICD-10-CM

## 2021-03-08 NOTE — Progress Notes (Signed)
Established Patient Office Visit  Subjective:  Patient ID: Christina Oliver, female    DOB: 16-Apr-1934  Age: 87 y.o. MRN: [id number]  CC:  Chief Complaint  Patient presents with   Nutrition Counseling   Edema    Patient complains of edema, x2 months,     HPI Christina Oliver presents to discuss several items today as follows  Recent weight loss.  Her weight was 113 pounds in March and right at 105 pounds today.  Unintentional weight loss.  She does states she has good appetite..  She eats 3 meals per day and some snacks.  For example, she states her typical meals are 2 to 3 piece of toast with couple eggs in the morning, salad with avocado and toast in the afternoon, salad with chicken or beans and rice at night.  Sometimes does snack such as yogurt with nuts.  Is not doing calorie counting.  He is dealing with tremendous stress of her husband has been on hospice for the past year.  He is totally dependent in care.  She does wonder if the stress issues have any impact.  Denies major depression symptoms.  She does stay very active.  She push mows her yard and states she usually gets about 15 miles per week with push mowing  Recently had some small pellet-like stools.  Started taking a probiotic and symptoms have improved.  Past history of B12 deficiency.  Last level 181.  Currently not taking B12.  No neuropathy symptoms.  She has chronic normocytic anemia by recent labs.  Labs from March reviewed.  Thyroid function normal.  Albumin at that time was 4.2.  Denies any recent headaches, swallowing difficulties, abdominal pain, chronic cough, adenopathy.  Past Medical History:  Diagnosis Date   CONTUSION, LEG 01/30/2010   SCIATICA, ACUTE 10/05/2008    Past Surgical History:  Procedure Laterality Date   VEIN SURGERY     varicose, right leg    Family History  Problem Relation Age of Onset   Cancer Son        leukemia    Social History   Socioeconomic History   Marital status:  Married    Spouse name: Not on file   Number of children: Not on file   Years of education: Not on file   Highest education level: Not on file  Occupational History   Not on file  Tobacco Use   Smoking status: Never   Smokeless tobacco: Never  Vaping Use   Vaping Use: Never used  Substance and Sexual Activity   Alcohol use: Yes   Drug use: No   Sexual activity: Not on file  Other Topics Concern   Not on file  Social History Narrative   Not on file   Social Determinants of Health   Financial Resource Strain: Low Risk    Difficulty of Paying Living Expenses: Not hard at all  Food Insecurity: No Food Insecurity   Worried About Charity fundraiser in the Last Year: Never true   [place] in the Last Year: Never true  Transportation Needs: No Transportation Needs   Lack of Transportation (Medical): No   Lack of Transportation (Non-Medical): No  Physical Activity: Insufficiently Active   Days of Exercise per Week: 1 day   Minutes of Exercise per Session: 90 min  Stress: No Stress Concern Present   Feeling of Stress : Not at all  Social Connections: Moderately Integrated   Frequency of Communication  with Friends and Family: More than three times a week   Frequency of Social Gatherings with Friends and Family: Once a week   Attends Religious Services: Never   Marine scientist or Organizations: Yes   Attends Music therapist: 1 to 4 times per year   Marital Status: Married  Human resources officer Violence: Not At Risk   Fear of Current or Ex-Partner: No   Emotionally Abused: No   Physically Abused: No   Sexually Abused: No    Outpatient Medications Prior to Visit  Medication Sig Dispense Refill   PFIZER-BIONT COVID-19 VAC-TRIS SUSP injection      B Complex-Folic Acid (B COMPLEX-VITAMIN B12 PO) Take by mouth. (Patient not taking: Reported on 03/07/2021)     Multiple Vitamins-Minerals (VITAMIN D3 COMPLETE) TABS Take by mouth. (Patient not taking: Reported  on 03/07/2021)     Omega-3 Fatty Acids (FISH OIL) 1000 MG CAPS Take by mouth. (Patient not taking: Reported on 03/07/2021)     No facility-administered medications prior to visit.    Allergies  Allergen Reactions   Sulfadiazine     As a child    ROS Review of Systems  Constitutional:  Negative for appetite change, chills, fatigue and unexpected weight change.  Respiratory:  Negative for cough and shortness of breath.   Cardiovascular:  Negative for chest pain.  Gastrointestinal:  Negative for abdominal pain, blood in stool, diarrhea, nausea and vomiting.  Neurological:  Negative for headaches.  Hematological:  Negative for adenopathy.     Objective:    Physical Exam Vitals reviewed.  Constitutional:      Appearance: Normal appearance.  Cardiovascular:     Rate and Rhythm: Normal rate and regular rhythm.  Pulmonary:     Effort: Pulmonary effort is normal.     Breath sounds: Normal breath sounds.  Musculoskeletal:     Cervical back: Neck supple.     Right lower leg: No edema.     Left lower leg: No edema.  Lymphadenopathy:     Cervical: No cervical adenopathy.  Neurological:     Mental Status: She is alert.    BP 126/70 (BP Location: Left Arm, Patient Position: Sitting, Cuff Size: Normal)   Pulse 65   Temp 97.8 F (36.6 C) (Oral)   Ht 5' (1.524 m)   Wt 104 lb 12.8 oz (47.5 kg)   SpO2 97%   BMI 20.47 kg/m  Wt Readings from Last 3 Encounters:  03/08/21 104 lb 12.8 oz (47.5 kg)  03/07/21 104 lb 14.4 oz (47.6 kg)  09/12/20 113 lb 4.8 oz (51.4 kg)     Health Maintenance Due  Topic Date Due   Zoster Vaccines- Shingrix (1 of 2) Never done   INFLUENZA VACCINE  01/16/2021    There are no preventive care reminders to display for this patient.  Lab Results  Component Value Date   TSH 0.97 09/12/2020   Lab Results  Component Value Date   WBC 4.7 09/12/2020   HGB 11.1 (L) 09/12/2020   HCT 33.3 (L) 09/12/2020   MCV 85.0 09/12/2020   PLT 234.0 09/12/2020    Lab Results  Component Value Date   NA 141 09/12/2020   K 4.3 09/12/2020   CO2 28 09/12/2020   GLUCOSE 106 (H) 09/12/2020   BUN 20 09/12/2020   CREATININE 0.72 09/12/2020   BILITOT 0.8 09/12/2020   ALKPHOS 75 09/12/2020   AST 18 09/12/2020   ALT 13 09/12/2020   PROT 6.5 09/12/2020  ALBUMIN 4.2 09/12/2020   CALCIUM 9.2 09/12/2020   GFR 76.84 09/12/2020   Lab Results  Component Value Date   CHOL 274 (H) 09/12/2020   Lab Results  Component Value Date   HDL 74.50 09/12/2020   Lab Results  Component Value Date   LDLCALC 181 (H) 09/12/2020   Lab Results  Component Value Date   TRIG 91.0 09/12/2020   Lab Results  Component Value Date   CHOLHDL 4 09/12/2020   No results found for: HGBA1C    Assessment & Plan:   #1 weight loss.  This has been unintentional.  Encouraging is the fact she does have good appetite and seems to be eating very balanced diet.  Query whether some of this is related to stress of caring for her husband.  Denies major depression issues at this time.  No other new worrisome symptoms.  -We talked about healthy calorie supplementations -She would like to continue to monitor this time.  #2 history of B12 deficiency.  We recommend regular oral supplementation with daily B12 500-1,000 mcg and recheck B12 level at follow-up in March  #3 history of intermittent constipation.  Improved with probiotic supplementation.  Continue supplementation  No orders of the defined types were placed in this encounter.   Follow-up: No follow-ups on file.    Carolann Littler, MD

## 2021-03-08 NOTE — Patient Instructions (Addendum)
Make sure you are taking the oral B12 at least 500 mcg daily.   Continue to monitor weight and be in touch if any further loss.

## 2021-08-03 ENCOUNTER — Encounter: Payer: Self-pay | Admitting: Family Medicine

## 2021-08-04 ENCOUNTER — Ambulatory Visit (INDEPENDENT_AMBULATORY_CARE_PROVIDER_SITE_OTHER): Payer: No Typology Code available for payment source | Admitting: Family Medicine

## 2021-08-04 ENCOUNTER — Ambulatory Visit (INDEPENDENT_AMBULATORY_CARE_PROVIDER_SITE_OTHER): Payer: No Typology Code available for payment source

## 2021-08-04 ENCOUNTER — Other Ambulatory Visit: Payer: Self-pay

## 2021-08-04 VITALS — BP 132/70 | HR 70 | Temp 97.5°F | Wt 110.7 lb

## 2021-08-04 DIAGNOSIS — R0789 Other chest pain: Secondary | ICD-10-CM

## 2021-08-04 DIAGNOSIS — R079 Chest pain, unspecified: Secondary | ICD-10-CM | POA: Diagnosis not present

## 2021-08-04 NOTE — Patient Instructions (Signed)
Follow up for any fever, shortness of breath,  pain with breathing  Continue with the Tylenol and/or Ibuprofen as needed.

## 2021-08-04 NOTE — Progress Notes (Signed)
Established Patient Office Visit  Subjective:  Patient ID: Christina Oliver, female    DOB: Nov 14, 1933  Age: 88 y.o. MRN: [id number]  CC:  Chief Complaint  Patient presents with   Flank Pain    Bilat pain in the rib area that radiates around to the back, x 1 week, sharp pains that come and go, worse when moving, no pain with breathing but reports slight SOB    HPI [name] presents for approximately 1 week history of somewhat nondescript poorly localized pain mostly lower rib cage area that radiates somewhat bilaterally and occasionally sharp.  Pain is very intermittent.  She does not recall any injury.  Her husband is basically bedbound and she does reposition him frequently but does not recall specific injury.  Pain seems to be somewhat lessened with leaning forward and also with Tylenol.  No upper chest pain.  No cough.  No fever.  No pleuritic pain.  Pain quality is sharp at times.  Pain is worse with movement.  No abdominal pain  Past Medical History:  Diagnosis Date   CONTUSION, LEG 01/30/2010   SCIATICA, ACUTE 10/05/2008    Past Surgical History:  Procedure Laterality Date   VEIN SURGERY     varicose, right leg    Family History  Problem Relation Age of Onset   Cancer Son        leukemia    Social History   Socioeconomic History   Marital status: Married    Spouse name: Not on file   Number of children: Not on file   Years of education: Not on file   Highest education level: Not on file  Occupational History   Not on file  Tobacco Use   Smoking status: Never   Smokeless tobacco: Never  Vaping Use   Vaping Use: Never used  Substance and Sexual Activity   Alcohol use: Yes   Drug use: No   Sexual activity: Not on file  Other Topics Concern   Not on file  Social History Narrative   Not on file   Social Determinants of Health   Financial Resource Strain: Low Risk    Difficulty of Paying Living Expenses: Not hard at all  Food Insecurity: No Food  Insecurity   Worried About Charity fundraiser in the Last Year: Never true   [place] in the Last Year: Never true  Transportation Needs: No Transportation Needs   Lack of Transportation (Medical): No   Lack of Transportation (Non-Medical): No  Physical Activity: Insufficiently Active   Days of Exercise per Week: 1 day   Minutes of Exercise per Session: 90 min  Stress: No Stress Concern Present   Feeling of Stress : Not at all  Social Connections: Moderately Integrated   Frequency of Communication with Friends and Family: More than three times a week   Frequency of Social Gatherings with Friends and Family: Once a week   Attends Religious Services: Never   Marine scientist or Organizations: Yes   Attends Music therapist: 1 to 4 times per year   Marital Status: Married  Human resources officer Violence: Not At Risk   Fear of Current or Ex-Partner: No   Emotionally Abused: No   Physically Abused: No   Sexually Abused: No    Outpatient Medications Prior to Visit  Medication Sig Dispense Refill   PFIZER-BIONT COVID-19 VAC-TRIS SUSP injection      No facility-administered medications prior to visit.  Allergies  Allergen Reactions   Sulfadiazine     As a child    ROS Review of Systems  Constitutional:  Negative for fever.  Respiratory:  Negative for cough and wheezing.   Cardiovascular:  Negative for palpitations and leg swelling.  Gastrointestinal:  Negative for abdominal pain.  Neurological:  Negative for dizziness.     Objective:    Physical Exam Vitals reviewed.  Cardiovascular:     Rate and Rhythm: Normal rate and regular rhythm.  Pulmonary:     Effort: Pulmonary effort is normal.     Breath sounds: Normal breath sounds. No wheezing or rales.  Musculoskeletal:     Right lower leg: No edema.     Left lower leg: No edema.  Neurological:     Mental Status: She is alert.    BP 132/70 (BP Location: Left Arm, Patient Position: Sitting,  Cuff Size: Normal)    Pulse 70    Temp (!) 97.5 F (36.4 C) (Oral)    Wt 110 lb 11.2 oz (50.2 kg)    SpO2 98%    BMI 21.62 kg/m  Wt Readings from Last 3 Encounters:  08/04/21 110 lb 11.2 oz (50.2 kg)  03/08/21 104 lb 12.8 oz (47.5 kg)  03/07/21 104 lb 14.4 oz (47.6 kg)     Health Maintenance Due  Topic Date Due   Zoster Vaccines- Shingrix (1 of 2) Never done   COVID-19 Vaccine (5 - Booster for Pfizer series) 11/11/2020   TETANUS/TDAP  07/10/2021    There are no preventive care reminders to display for this patient.  Lab Results  Component Value Date   TSH 0.97 09/12/2020   Lab Results  Component Value Date   WBC 4.7 09/12/2020   HGB 11.1 (L) 09/12/2020   HCT 33.3 (L) 09/12/2020   MCV 85.0 09/12/2020   PLT 234.0 09/12/2020   Lab Results  Component Value Date   NA 141 09/12/2020   K 4.3 09/12/2020   CO2 28 09/12/2020   GLUCOSE 106 (H) 09/12/2020   BUN 20 09/12/2020   CREATININE 0.72 09/12/2020   BILITOT 0.8 09/12/2020   ALKPHOS 75 09/12/2020   AST 18 09/12/2020   ALT 13 09/12/2020   PROT 6.5 09/12/2020   ALBUMIN 4.2 09/12/2020   CALCIUM 9.2 09/12/2020   GFR 76.84 09/12/2020   Lab Results  Component Value Date   CHOL 274 (H) 09/12/2020   Lab Results  Component Value Date   HDL 74.50 09/12/2020   Lab Results  Component Value Date   LDLCALC 181 (H) 09/12/2020   Lab Results  Component Value Date   TRIG 91.0 09/12/2020   Lab Results  Component Value Date   CHOLHDL 4 09/12/2020   No results found for: HGBA1C    Assessment & Plan:   Problem List Items Addressed This Visit   None Visit Diagnoses     Chest wall pain    -  Primary   Relevant Orders   DG Chest 2 View     Patient is seen with approxi-1 week history of poorly localized pain lower chest wall region somewhat bilaterally.  Suspect musculoskeletal based on the fact this is worse with movement and improved with Tylenol.  Does not any red flags such as significant dyspnea, cough, fever,  etc.  -Obtain PA and lateral chest x-ray -Continue Tylenol.  Cautious supplement with Advil short-term as needed -Follow-up immediately for any fever, dyspnea, skin rashes, or other concerns  No orders of the defined  types were placed in this encounter.   Follow-up: No follow-ups on file.    Carolann Littler, MD

## 2021-09-13 ENCOUNTER — Ambulatory Visit (INDEPENDENT_AMBULATORY_CARE_PROVIDER_SITE_OTHER): Payer: No Typology Code available for payment source | Admitting: Family Medicine

## 2021-09-13 ENCOUNTER — Encounter: Payer: Self-pay | Admitting: Family Medicine

## 2021-09-13 VITALS — BP 138/68 | HR 72 | Temp 97.4°F | Ht 64.0 in | Wt 109.3 lb

## 2021-09-13 DIAGNOSIS — Z0000 Encounter for general adult medical examination without abnormal findings: Secondary | ICD-10-CM

## 2021-09-13 DIAGNOSIS — E538 Deficiency of other specified B group vitamins: Secondary | ICD-10-CM | POA: Diagnosis not present

## 2021-09-13 LAB — BASIC METABOLIC PANEL
BUN: 22 mg/dL (ref 6–23)
CO2: 26 mEq/L (ref 19–32)
Calcium: 9.4 mg/dL (ref 8.4–10.5)
Chloride: 106 mEq/L (ref 96–112)
Creatinine, Ser: 0.79 mg/dL (ref 0.40–1.20)
GFR: 68.26 mL/min (ref >60.00)
Glucose, Bld: 84 mg/dL (ref 70–99)
Potassium: 4.4 mEq/L (ref 3.5–5.1)
Sodium: 140 mEq/L (ref 135–145)

## 2021-09-13 LAB — CBC WITH DIFFERENTIAL/PLATELET
Basophils Absolute: 0 10*3/uL (ref 0.0–0.1)
Basophils Relative: 0.7 % (ref 0.0–3.0)
Eosinophils Absolute: 0.2 10*3/uL (ref 0.0–0.7)
Eosinophils Relative: 3.2 % (ref 0.0–5.0)
HCT: 34.1 % — ABNORMAL LOW (ref 36.0–46.0)
Hemoglobin: 11.2 g/dL — ABNORMAL LOW (ref 12.0–15.0)
Lymphocytes Relative: 33 % (ref 12.0–46.0)
Lymphs Abs: 1.8 10*3/uL (ref 0.7–4.0)
MCHC: 32.8 g/dL (ref 30.0–36.0)
MCV: 85.9 fl (ref 78.0–100.0)
Monocytes Absolute: 0.5 10*3/uL (ref 0.1–1.0)
Monocytes Relative: 9.1 % (ref 3.0–12.0)
Neutro Abs: 2.9 10*3/uL (ref 1.4–7.7)
Neutrophils Relative %: 54 % (ref 43.0–77.0)
Platelets: 220 10*3/uL (ref 150.0–400.0)
RBC: 3.97 Mil/uL (ref 3.87–5.11)
RDW: 14.1 % (ref 11.5–15.5)
WBC: 5.4 10*3/uL (ref 4.0–10.5)

## 2021-09-13 LAB — VITAMIN D 25 HYDROXY (VIT D DEFICIENCY, FRACTURES): VITD: 31.86 ng/mL (ref 30.00–100.00)

## 2021-09-13 LAB — TSH: TSH: 0.72 u[IU]/mL (ref 0.35–5.50)

## 2021-09-13 LAB — VITAMIN B12: Vitamin B-12: 1490 pg/mL — ABNORMAL HIGH (ref 211–911)

## 2021-09-13 NOTE — Progress Notes (Signed)
? ?Established Patient Office Visit ? ?Subjective:  ?Patient ID: Christina Oliver, female    DOB: 1934/02/25  Age: 88 y.o. MRN: [id number] ? ?CC:  ?Chief Complaint  ?Patient presents with  ? Annual Exam  ? ? ?HPI ?[name] presents for annual physical exam.  Last visit she had some poorly localized chest wall pains and that has resolved.  She thinks this may have stemmed from recent viral infection.  She has no cough at this time.  No dyspnea. ? ?She has husband who is bedridden and on hospice.  He has been on hospice for the past couple years.  She is extremely devoted to his care.  She has not been exercising quite as much this year but stays very active with her husband with his needs ? ?Patient has history of GERD but currently stable off medications.  She has history of hyperlipidemia and normocytic anemia.  Currently takes no regular medications.  She did have low B12 level of 181 last year.  No follow-up since then.  She is taking some replacement. ? ?She declines multiple health maintenance studies such as mammography and DEXA scan.  Aged out of colonoscopy and Pap smears. ? ?Social history-takes care of her husband who is bedridden.  Never smoked.  Son who lives in [place] area.  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 ? ?Past Medical History:  ?Diagnosis Date  ? CONTUSION, LEG 01/30/2010  ? SCIATICA, ACUTE 10/05/2008  ? ? ?Past Surgical History:  ?Procedure Laterality Date  ? VEIN SURGERY    ? varicose, right leg  ? ? ?Family History  ?Problem Relation Age of Onset  ? Cancer Son   ?     leukemia  ? ? ?Social History  ? ?Socioeconomic History  ? Marital status: Married  ?  Spouse name: Not on file  ? Number of children: Not on file  ? Years of education: Not on file  ? Highest education level: Not on file  ?Occupational History  ? Not on file  ?Tobacco Use  ? Smoking status: Never  ? Smokeless tobacco: Never  ?Vaping Use  ? Vaping Use: Never used  ?Substance and Sexual Activity  ? Alcohol use:  Yes  ? Drug use: No  ? Sexual activity: Not on file  ?Other Topics Concern  ? Not on file  ?Social History Narrative  ? Not on file  ? ?Social Determinants of Health  ? ?Financial Resource Strain: Low Risk   ? Difficulty of Paying Living Expenses: Not hard at all  ?Food Insecurity: No Food Insecurity  ? Worried About Charity fundraiser in the Last Year: Never true  ? Ran Out of Food in the Last Year: Never true  ?Transportation Needs: No Transportation Needs  ? Lack of Transportation (Medical): No  ? Lack of Transportation (Non-Medical): No  ?Physical Activity: Insufficiently Active  ? Days of Exercise per Week: 1 day  ? Minutes of Exercise per Session: 90 min  ?Stress: No Stress Concern Present  ? Feeling of Stress : Not at all  ?Social Connections: Moderately Integrated  ? Frequency of Communication with Friends and Family: More than three times a week  ? Frequency of Social Gatherings with Friends and Family: Once a week  ? Attends Religious Services: Never  ? Active Member of Clubs or Organizations: Yes  ? Attends Archivist Meetings: 1 to 4 times per year  ? Marital Status: Married  ?Intimate Partner Violence: Not At Risk  ?  Fear of Current or Ex-Partner: No  ? Emotionally Abused: No  ? Physically Abused: No  ? Sexually Abused: No  ? ? ?Outpatient Medications Prior to Visit  ?Medication Sig Dispense Refill  ? PFIZER-BIONT COVID-19 VAC-TRIS SUSP injection     ? ?No facility-administered medications prior to visit.  ? ? ?Allergies  ?Allergen Reactions  ? Sulfadiazine   ?  As a child  ? ? ?ROS ?Review of Systems  ?Constitutional:  Negative for fatigue.  ?Eyes:  Negative for visual disturbance.  ?Respiratory:  Negative for cough, chest tightness, shortness of breath and wheezing.   ?Cardiovascular:  Negative for chest pain, palpitations and leg swelling.  ?Neurological:  Negative for dizziness, seizures, syncope, weakness, light-headedness and headaches.  ? ?  ?Objective:  ?  ?Physical  Exam ?Constitutional:   ?   Appearance: She is well-developed.  ?Eyes:  ?   Pupils: Pupils are equal, round, and reactive to light.  ?Neck:  ?   Thyroid: No thyromegaly.  ?   Vascular: No JVD.  ?Cardiovascular:  ?   Rate and Rhythm: Normal rate and regular rhythm.  ?   Heart sounds:  ?  No gallop.  ?Pulmonary:  ?   Effort: Pulmonary effort is normal. No respiratory distress.  ?   Breath sounds: Normal breath sounds. No wheezing or rales.  ?Musculoskeletal:  ?   Cervical back: Neck supple.  ?   Right lower leg: No edema.  ?   Left lower leg: No edema.  ?Neurological:  ?   General: No focal deficit present.  ?   Mental Status: She is alert.  ? ? ?BP 138/68 (BP Location: Left Arm, Cuff Size: Normal)   Pulse 72   Temp (!) 97.4 ?F (36.3 ?C) (Oral)   Ht '5\' 4"'$  (1.626 m)   Wt 109 lb 4.8 oz (49.6 kg)   SpO2 96%   BMI 18.76 kg/m?  ?Wt Readings from Last 3 Encounters:  ?09/13/21 109 lb 4.8 oz (49.6 kg)  ?08/04/21 110 lb 11.2 oz (50.2 kg)  ?03/08/21 104 lb 12.8 oz (47.5 kg)  ? ? ? ?Health Maintenance Due  ?Topic Date Due  ? COVID-19 Vaccine (5 - Booster for Pfizer series) 11/11/2020  ? TETANUS/TDAP  07/10/2021  ? ? ?There are no preventive care reminders to display for this patient. ? ?Lab Results  ?Component Value Date  ? TSH 0.97 09/12/2020  ? ?Lab Results  ?Component Value Date  ? WBC 4.7 09/12/2020  ? HGB 11.1 (L) 09/12/2020  ? HCT 33.3 (L) 09/12/2020  ? MCV 85.0 09/12/2020  ? PLT 234.0 09/12/2020  ? ?Lab Results  ?Component Value Date  ? NA 141 09/12/2020  ? K 4.3 09/12/2020  ? CO2 28 09/12/2020  ? GLUCOSE 106 (H) 09/12/2020  ? BUN 20 09/12/2020  ? CREATININE 0.72 09/12/2020  ? BILITOT 0.8 09/12/2020  ? ALKPHOS 75 09/12/2020  ? AST 18 09/12/2020  ? ALT 13 09/12/2020  ? PROT 6.5 09/12/2020  ? ALBUMIN 4.2 09/12/2020  ? CALCIUM 9.2 09/12/2020  ? GFR 76.84 09/12/2020  ? ?Lab Results  ?Component Value Date  ? CHOL 274 (H) 09/12/2020  ? ?Lab Results  ?Component Value Date  ? HDL 74.50 09/12/2020  ? ?Lab Results  ?Component  Value Date  ? LDLCALC 181 (H) 09/12/2020  ? ?Lab Results  ?Component Value Date  ? TRIG 91.0 09/12/2020  ? ?Lab Results  ?Component Value Date  ? CHOLHDL 4 09/12/2020  ? ?No results found for:  HGBA1C ? ?  ?Assessment & Plan:  ? ?Problem List Items Addressed This Visit   ?None ?Visit Diagnoses   ? ? Physical exam    -  Primary  ? Relevant Orders  ? Basic metabolic panel  ? CBC with Differential/Platelet  ? TSH  ? Vitamin B12  ? VITAMIN D 25 Hydroxy (Vit-D Deficiency, Fractures)  ? Low serum vitamin B12      ? Relevant Orders  ? Vitamin B12  ? ?  ?We discussed health maintenance issues.  She will continue with annual flu vaccine.  Pneumonia vaccines complete.  She will consider tetanus at some point this year. ? ?-Obtain labs as above.  Repeat B12 level with history of low last year ?-We discussed DEXA scan and she declines ?-She declines further mammography ? ?No orders of the defined types were placed in this encounter. ? ? ?Follow-up: No follow-ups on file.  ? ? ?Carolann Littler, MD ?

## 2021-10-24 ENCOUNTER — Encounter: Payer: Self-pay | Admitting: Family Medicine

## 2021-12-04 ENCOUNTER — Encounter: Payer: Self-pay | Admitting: Family Medicine

## 2022-02-06 ENCOUNTER — Encounter: Payer: Self-pay | Admitting: Family Medicine

## 2022-02-07 ENCOUNTER — Encounter: Payer: Self-pay | Admitting: Family Medicine

## 2022-02-07 ENCOUNTER — Telehealth (INDEPENDENT_AMBULATORY_CARE_PROVIDER_SITE_OTHER): Payer: No Typology Code available for payment source | Admitting: Family Medicine

## 2022-02-07 VITALS — Ht 64.0 in | Wt 109.3 lb

## 2022-02-07 DIAGNOSIS — U071 COVID-19: Secondary | ICD-10-CM

## 2022-02-07 MED ORDER — NIRMATRELVIR/RITONAVIR (PAXLOVID)TABLET: 3.0000 | ORAL_TABLET | Freq: Two times a day (BID) | ORAL | 0 refills | Status: AC

## 2022-02-07 NOTE — Progress Notes (Signed)
Patient ID: Christina Oliver, female   DOB: 06-30-1933, 88 y.o.   MRN: [id number]   Virtual Visit via Telephone Note  I connected with Christina Oliver on 37/48/27 at  4:15 PM EDT by telephone and verified that I am speaking with the correct person using two identifiers.   I discussed the limitations, risks, security and privacy concerns of performing an evaluation and management service by telephone and the availability of in person appointments. I also discussed with the patient that there may be a patient responsible charge related to this service. The patient expressed understanding and agreed to proceed.  Location patient: home Location provider: work or home office Participants present for the call: patient, provider Patient did not have a visit in the prior 7 days to address this/these issue(s).   History of Present Illness: [name] has COVID-19 by home test yesterday.  She actually has several family members that are currently visiting.  None of them are symptomatic and all of them have tested negative.  Patient recalls going to tai chi class last Thursday night and did not use a mask.  She thinks she may have contacted COVID then.  She had first onset of symptoms late Saturday.  She has had some low-grade fever, sneezing, nasal congestion, mild headache, fatigue and mild dry cough.  No significant dyspnea.  Has taken [facility] and Tylenol for symptoms.  Symptoms were worse by Monday and little improved at this time.  No nausea or vomiting.  She expressed frustration that she called our office around 5 minutes before 5 PM and no one ever answered.  Patient left detailed note with her concerns at 5:15 PM on 02-06-2022 and this was routed to [place] late yesterday but not viewed until this morning.  Her other concern was that she states she called back later in the evening to our on call line and states that her call was never answered.  Past Medical History:  Diagnosis Date   CONTUSION,  LEG 01/30/2010   SCIATICA, ACUTE 10/05/2008   Past Surgical History:  Procedure Laterality Date   VEIN SURGERY     varicose, right leg    reports that she has never smoked. She has never used smokeless tobacco. She reports current alcohol use. She reports that she does not use drugs. family history includes Cancer in her son. Allergies  Allergen Reactions   Sulfadiazine     As a child      Observations/Objective: Patient sounds cheerful and well on the phone. I do not appreciate any SOB. Speech and thought processing are grossly intact. Patient reported vitals:  Assessment and Plan:  COVID-19.  Patient is nearing day 5.  She is 88 years old but very healthy for age with no major comorbidities.  -We did discuss possible Paxlovid use.  She does not have any history of renal issues or concerns and had renal profile 4 months ago with GFR over 60 -Reviewed isolation precautions. -Plenty of fluids and rest -Follow-up for increased dyspnea or other concerns  Follow Up Instructions:   [id number] 5-10 [id number] 11-20 [id number] 21-30 I did not refer this patient for an OV in the next 24 hours for this/these issue(s).  I discussed the assessment and treatment plan with the patient. The patient was provided an opportunity to ask questions and all were answered. The patient agreed with the plan and demonstrated an understanding of the instructions.   The patient was advised to call back or seek an in-person evaluation  if the symptoms worsen or if the condition fails to improve as anticipated.  I provided 21 minutes of non-face-to-face time during this encounter.   Carolann Littler, MD

## 2022-02-07 NOTE — Telephone Encounter (Signed)
Patient added to schedule for today

## 2022-02-07 NOTE — Telephone Encounter (Signed)
Patient added to schedule for today.

## 2022-02-09 ENCOUNTER — Encounter: Payer: Self-pay | Admitting: Family Medicine

## 2022-02-13 NOTE — Telephone Encounter (Signed)
I called and spoke to the patient to address her concerns and apologized for the length of time that it took to call her. She stated that she was very disappointed in the fact that she called in and did not get anyone on the phone when she was so sick.  She stated that she held on the line for a while before 5 and then the phone hung up on her,. She tried to call back after 5 and the phone call would not go through. I apologized for her experience and  I let her know that we had became aware of some technical phone issues that had occurred  over the last week. I also let her know that  would follow up with the practice administrator to ensure that staff are answering all calls that come into the office before 5 pm. I let the patient know that I appreciated her informing us of her concerns and that we would work on resolutions of the issues she encountered. Patient did let me know that she put the experience in her survey. I offered her the patient experience phone number if she had any further concerns and she asked me to send it to her. I let her know that I will send it to her through mychart.  She did say that she was still not feeling well and that she would send Dr. Elease Hashimoto a message with those concerns.

## 2022-02-16 ENCOUNTER — Encounter: Payer: Self-pay | Admitting: Family Medicine

## 2022-02-22 ENCOUNTER — Encounter: Payer: Self-pay | Admitting: Family Medicine

## 2022-03-01 ENCOUNTER — Encounter: Payer: Self-pay | Admitting: Family Medicine

## 2022-03-13 ENCOUNTER — Encounter: Payer: Self-pay | Admitting: Family Medicine

## 2022-03-15 DIAGNOSIS — L570 Actinic keratosis: Secondary | ICD-10-CM | POA: Diagnosis not present

## 2022-03-15 DIAGNOSIS — L821 Other seborrheic keratosis: Secondary | ICD-10-CM | POA: Diagnosis not present

## 2022-03-15 DIAGNOSIS — D2262 Melanocytic nevi of left upper limb, including shoulder: Secondary | ICD-10-CM | POA: Diagnosis not present

## 2022-03-15 DIAGNOSIS — D225 Melanocytic nevi of trunk: Secondary | ICD-10-CM | POA: Diagnosis not present

## 2022-03-15 DIAGNOSIS — D2261 Melanocytic nevi of right upper limb, including shoulder: Secondary | ICD-10-CM | POA: Diagnosis not present

## 2022-03-15 DIAGNOSIS — R208 Other disturbances of skin sensation: Secondary | ICD-10-CM | POA: Diagnosis not present

## 2022-03-15 DIAGNOSIS — L718 Other rosacea: Secondary | ICD-10-CM | POA: Diagnosis not present

## 2022-03-20 ENCOUNTER — Ambulatory Visit (INDEPENDENT_AMBULATORY_CARE_PROVIDER_SITE_OTHER): Payer: No Typology Code available for payment source

## 2022-03-20 VITALS — BP 122/70 | HR 68 | Temp 98.1°F | Ht 63.0 in | Wt 109.7 lb

## 2022-03-20 DIAGNOSIS — Z0000 Encounter for general adult medical examination without abnormal findings: Secondary | ICD-10-CM | POA: Diagnosis not present

## 2022-03-20 DIAGNOSIS — Z23 Encounter for immunization: Secondary | ICD-10-CM | POA: Diagnosis not present

## 2022-03-20 NOTE — Patient Instructions (Signed)
Christina Oliver , Thank you for taking time to come for your Medicare Wellness Visit. I appreciate your ongoing commitment to your health goals. Please review the following plan we discussed and let me know if I can assist you in the future.   Screening recommendations/referrals: Colonoscopy: not required Mammogram: not required Bone Density:  n/a Recommended yearly ophthalmology/optometry visit for glaucoma screening and checkup Recommended yearly dental visit for hygiene and checkup  Vaccinations: Influenza vaccine: today Pneumococcal vaccine: completed 07/15/2015 Tdap vaccine: due Shingles vaccine: discussed   Covid-19: 09/16/2020, 04/04/2020, 09/01/2019, 08/11/2019  Advanced directives: Please bring a copy of your POA (Power of Attorney) and/or Living Will to your next appointment.   Conditions/risks identified: none  Next appointment: Follow up in one year for your annual wellness visit    Preventive Care 65 Years and Older, Female Preventive care refers to lifestyle choices and visits with your health care provider that can promote health and wellness. What does preventive care include? A yearly physical exam. This is also called an annual well check. Dental exams once or twice a year. Routine eye exams. Ask your health care provider how often you should have your eyes checked. Personal lifestyle choices, including: Daily care of your teeth and gums. Regular physical activity. Eating a healthy diet. Avoiding tobacco and drug use. Limiting alcohol use. Practicing safe sex. Taking low-dose aspirin every day. Taking vitamin and mineral supplements as recommended by your health care provider. What happens during an annual well check? The services and screenings done by your health care provider during your annual well check will depend on your age, overall health, lifestyle risk factors, and family history of disease. Counseling  Your health care provider may ask you questions  about your: Alcohol use. Tobacco use. Drug use. Emotional well-being. Home and relationship well-being. Sexual activity. Eating habits. History of falls. Memory and ability to understand (cognition). Work and work Statistician. Reproductive health. Screening  You may have the following tests or measurements: Height, weight, and BMI. Blood pressure. Lipid and cholesterol levels. These may be checked every 5 years, or more frequently if you are over 80 years old. Skin check. Lung cancer screening. You may have this screening every year starting at age 56 if you have a 30-pack-year history of smoking and currently smoke or have quit within the past 15 years. Fecal occult blood test (FOBT) of the stool. You may have this test every year starting at age 64. Flexible sigmoidoscopy or colonoscopy. You may have a sigmoidoscopy every 5 years or a colonoscopy every 10 years starting at age 33. Hepatitis C blood test. Hepatitis B blood test. Sexually transmitted disease (STD) testing. Diabetes screening. This is done by checking your blood sugar (glucose) after you have not eaten for a while (fasting). You may have this done every 1-3 years. Bone density scan. This is done to screen for osteoporosis. You may have this done starting at age 62. Mammogram. This may be done every 1-2 years. Talk to your health care provider about how often you should have regular mammograms. Talk with your health care provider about your test results, treatment options, and if necessary, the need for more tests. Vaccines  Your health care provider may recommend certain vaccines, such as: Influenza vaccine. This is recommended every year. Tetanus, diphtheria, and acellular pertussis (Tdap, Td) vaccine. You may need a Td booster every 10 years. Zoster vaccine. You may need this after age 66. Pneumococcal 13-valent conjugate (PCV13) vaccine. One dose is recommended after  age 60. Pneumococcal polysaccharide (PPSV23)  vaccine. One dose is recommended after age 27. Talk to your health care provider about which screenings and vaccines you need and how often you need them. This information is not intended to replace advice given to you by your health care provider. Make sure you discuss any questions you have with your health care provider. Document Released: 07/01/2015 Document Revised: 02/22/2016 Document Reviewed: 04/05/2015 Elsevier Interactive Patient Education  2017 [place] Prevention in the Home Falls can cause injuries. They can happen to people of all ages. There are many things you can do to make your home safe and to help prevent falls. What can I do on the outside of my home? Regularly fix the edges of walkways and driveways and fix any cracks. Remove anything that might make you trip as you walk through a door, such as a raised step or threshold. Trim any bushes or trees on the path to your home. Use bright outdoor lighting. Clear any walking paths of anything that might make someone trip, such as rocks or tools. Regularly check to see if handrails are loose or broken. Make sure that both sides of any steps have handrails. Any raised decks and porches should have guardrails on the edges. Have any leaves, snow, or ice cleared regularly. Use sand or salt on walking paths during winter. Clean up any spills in your garage right away. This includes oil or grease spills. What can I do in the bathroom? Use night lights. Install grab bars by the toilet and in the tub and shower. Do not use towel bars as grab bars. Use non-skid mats or decals in the tub or shower. If you need to sit down in the shower, use a plastic, non-slip stool. Keep the floor dry. Clean up any water that spills on the floor as soon as it happens. Remove soap buildup in the tub or shower regularly. Attach bath mats securely with double-sided non-slip rug tape. Do not have throw rugs and other things on the floor that  can make you trip. What can I do in the bedroom? Use night lights. Make sure that you have a light by your bed that is easy to reach. Do not use any sheets or blankets that are too big for your bed. They should not hang down onto the floor. Have a firm chair that has side arms. You can use this for support while you get dressed. Do not have throw rugs and other things on the floor that can make you trip. What can I do in the kitchen? Clean up any spills right away. Avoid walking on wet floors. Keep items that you use a lot in easy-to-reach places. If you need to reach something above you, use a strong step stool that has a grab bar. Keep electrical cords out of the way. Do not use floor polish or wax that makes floors slippery. If you must use wax, use non-skid floor wax. Do not have throw rugs and other things on the floor that can make you trip. What can I do with my stairs? Do not leave any items on the stairs. Make sure that there are handrails on both sides of the stairs and use them. Fix handrails that are broken or loose. Make sure that handrails are as long as the stairways. Check any carpeting to make sure that it is firmly attached to the stairs. Fix any carpet that is loose or worn. Avoid having throw rugs  at the top or bottom of the stairs. If you do have throw rugs, attach them to the floor with carpet tape. Make sure that you have a light switch at the top of the stairs and the bottom of the stairs. If you do not have them, ask someone to add them for you. What else can I do to help prevent falls? Wear shoes that: Do not have high heels. Have rubber bottoms. Are comfortable and fit you well. Are closed at the toe. Do not wear sandals. If you use a stepladder: Make sure that it is fully opened. Do not climb a closed stepladder. Make sure that both sides of the stepladder are locked into place. Ask someone to hold it for you, if possible. Clearly mark and make sure that you  can see: Any grab bars or handrails. First and last steps. Where the edge of each step is. Use tools that help you move around (mobility aids) if they are needed. These include: Canes. Walkers. Scooters. Crutches. Turn on the lights when you go into a dark area. Replace any light bulbs as soon as they burn out. Set up your furniture so you have a clear path. Avoid moving your furniture around. If any of your floors are uneven, fix them. If there are any pets around you, be aware of where they are. Review your medicines with your doctor. Some medicines can make you feel dizzy. This can increase your chance of falling. Ask your doctor what other things that you can do to help prevent falls. This information is not intended to replace advice given to you by your health care provider. Make sure you discuss any questions you have with your health care provider. Document Released: 03/31/2009 Document Revised: 11/10/2015 Document Reviewed: 07/09/2014 Elsevier Interactive Patient Education  2017 [employer].

## 2022-03-20 NOTE — Progress Notes (Signed)
Subjective:   Christina Oliver is a 88 y.o. female who presents for Medicare Annual (Subsequent) preventive examination.  Review of Systems     Cardiac Risk Factors include: advanced age (>95mn, >>65women);dyslipidemia     Objective:    Today's Vitals   03/20/22 0922  BP: 122/70  Pulse: 68  Temp: 98.1 F (36.7 C)  TempSrc: Oral  SpO2: 96%  Weight: 109 lb 11.2 oz (49.8 kg)  Height: '5\' 3"'$  (1.6 m)   Body mass index is 19.43 kg/m.     03/20/2022    9:31 AM 03/07/2021    9:43 AM 03/02/2020   11:22 AM 01/21/2018   10:43 AM 01/18/2017    9:43 AM  Advanced Directives  Does Patient Have a Medical Advance Directive? Yes Yes Yes Yes Yes  Type of AParamed[place]of [place] will Healthcare Power of [place]    Does patient want to make changes to medical advance directive?   No - Patient declined    Copy of [place]? No - copy requested No - copy requested No - copy requested      Current Medications (verified) Outpatient Encounter Medications as of 03/20/2022  Medication Sig   PFIZER-BIONT COVID-19 VAC-TRIS SUSP injection    No facility-administered encounter medications on file as of 03/20/2022.    Allergies (verified) Sulfadiazine   History: Past Medical History:  Diagnosis Date   CONTUSION, LEG 01/30/2010   SCIATICA, ACUTE 10/05/2008   Past Surgical History:  Procedure Laterality Date   VEIN SURGERY     varicose, right leg   Family History  Problem Relation Age of Onset   Cancer Son        leukemia   Social History   Socioeconomic History   Marital status: Married    Spouse name: Not on file   Number of children: Not on file   Years of education: Not on file   Highest education level: Not on file  Occupational History   Not on file  Tobacco Use   Smoking status: Never   Smokeless tobacco: Never  Vaping Use   Vaping Use: Never used  Substance and Sexual  Activity   Alcohol use: Yes   Drug use: No   Sexual activity: Not on file  Other Topics Concern   Not on file  Social History Narrative   Not on file   Social Determinants of Health   Financial Resource Strain: Low Risk  (03/20/2022)   Overall Financial Resource Strain (CARDIA)    Difficulty of Paying Living Expenses: Not hard at all  Food Insecurity: No Food Insecurity (03/20/2022)   Hunger Vital Sign    Worried About Running Out of Food in the Last Year: Never true    R[place]in the Last Year: Never true  Transportation Needs: No Transportation Needs (03/20/2022)   PRAPARE - THydrologist(Medical): No    Lack of Transportation (Non-Medical): No  Physical Activity: Sufficiently Active (03/20/2022)   Exercise Vital Sign    Days of Exercise per Week: 7 days    Minutes of Exercise per Session: 30 min  Stress: No Stress Concern Present (03/20/2022)   [place]   Feeling of Stress : Not at all  Social Connections: Moderately Integrated (03/07/2021)   Social Connection and Isolation Panel [NHANES]    Frequency of Communication  with Friends and Family: More than three times a week    Frequency of Social Gatherings with Friends and Family: Once a week    Attends Religious Services: Never    Marine scientist or Organizations: Yes    Attends Music therapist: 1 to 4 times per year    Marital Status: Married    Tobacco Counseling Counseling given: Not Answered   Clinical Intake:  Pre-visit preparation completed: Yes  Pain : No/denies pain     Nutritional Status: BMI of 19-24  Normal Nutritional Risks: None Diabetes: No  How often do you need to have someone help you when you read instructions, pamphlets, or other written materials from your doctor or pharmacy?: 1 - Never What is the last grade level you completed in school?: PhD  Diabetic?  no  Interpreter Needed?: No  Information entered by :: NAllen LPN   Activities of Daily Living    03/20/2022    9:32 AM  In your present state of health, do you have any difficulty performing the following activities:  Hearing? 0  Vision? 0  Difficulty concentrating or making decisions? 0  Walking or climbing stairs? 0  Dressing or bathing? 0  Doing errands, shopping? 0  Preparing Food and eating ? N  Using the Toilet? N  In the past six months, have you accidently leaked urine? N  Do you have problems with loss of bowel control? N  Managing your Medications? N  Managing your Finances? N  Housekeeping or managing your Housekeeping? N    Patient Care Team: Eulas Post, MD as PCP - General  Indicate any recent Medical Services you may have received from other than [facility] providers in the past year (date may be approximate).     Assessment:   This is a routine wellness examination for Christina Oliver.  Hearing/Vision screen Vision Screening - Comments:: Regular eye exams,   Dietary issues and exercise activities discussed: Current Exercise Habits: Home exercise routine, Type of exercise: walking, Time (Minutes): 30, Frequency (Times/Week): 7, Weekly Exercise (Minutes/Week): 210   Goals Addressed             This Visit's Progress    Patient Stated       03/20/2022, stay healthy       Depression Screen    03/20/2022    9:32 AM 08/04/2021   10:37 AM 03/07/2021    9:41 AM 03/02/2020   11:25 AM 09/11/2019    1:33 PM 07/28/2018    9:06 AM 01/21/2018   11:00 AM  PHQ 2/9 Scores  PHQ - 2 Score 0 0 0 0 0 0 0  PHQ- 9 Score    0  0     Fall Risk    03/20/2022    9:32 AM 08/04/2021   10:37 AM 03/07/2021    9:44 AM 03/02/2020   11:24 AM 09/11/2019    1:33 PM  Fall Risk   Falls in the past year? 0 0 0 0 0  Number falls in past yr: 0  0 0 0  Injury with Fall? 0  0 0 0  Risk for fall due to : No Fall Risks   No Fall Risks   Follow up Falls prevention discussed;Falls  evaluation completed;Education provided  Falls prevention discussed Falls evaluation completed;Falls prevention discussed Falls evaluation completed    FALL RISK PREVENTION PERTAINING TO THE HOME:  Any stairs in or around the home? Yes  If so, are there any  without handrails? No  Home free of loose throw rugs in walkways, pet beds, electrical cords, etc? Yes  Adequate lighting in your home to reduce risk of falls? Yes   ASSISTIVE DEVICES UTILIZED TO PREVENT FALLS:  Life alert? No  Use of a cane, walker or w/c? No  Grab bars in the bathroom? No  Shower chair or bench in shower? Yes  Elevated toilet seat or a handicapped toilet? Yes   TIMED UP AND GO:  Was the test performed? Yes .  Length of time to ambulate 10 feet: 5 sec.   Gait steady and fast without use of assistive device  Cognitive Function:        03/20/2022    9:33 AM 03/07/2021    9:45 AM 01/18/2017    9:49 AM  6CIT Screen  What Year? 0 points 0 points 0 points  What month? 0 points 0 points 0 points  What time? 0 points 0 points 0 points  Count back from 20 0 points 0 points 0 points  Months in reverse 0 points 0 points 0 points  Repeat phrase 2 points 0 points 0 points  Total Score 2 points 0 points 0 points    Immunizations Immunization History  Administered Date(s) Administered   Fluad Quad(high Dose 65+) 03/08/2021, 03/20/2022   Influenza Split 05/11/2011, 04/11/2012   Influenza Whole 06/29/2010   Influenza, High Dose Seasonal PF 04/10/2018, 03/31/2019   Influenza,inj,Quad PF,6+ Mos 04/08/2014   Influenza-Unspecified 03/18/2013, 04/19/2015, 03/18/2016, 02/04/2017   PFIZER(Purple Top)SARS-COV-2 Vaccination 08/11/2019, 09/01/2019, 04/04/2020, 09/16/2020   Pneumococcal Conjugate-13 07/15/2015   Pneumococcal Polysaccharide-23 06/26/2010   Td 06/18/2002   Tdap 07/11/2011    TDAP status: Due, Education has been provided regarding the importance of this vaccine. Advised may receive this vaccine at local  pharmacy or Health Dept. Aware to provide a copy of the vaccination record if obtained from local pharmacy or Health Dept. Verbalized acceptance and understanding.  Flu Vaccine status: Completed at today's visit  Pneumococcal vaccine status: Up to date  Covid-19 vaccine status: Completed vaccines  Qualifies for Shingles Vaccine? Yes   Zostavax completed No   Shingrix Completed?: No.    Education has been provided regarding the importance of this vaccine. Patient has been advised to call insurance company to determine out of pocket expense if they have not yet received this vaccine. Advised may also receive vaccine at local pharmacy or Health Dept. Verbalized acceptance and understanding.  Screening Tests Health Maintenance  Topic Date Due   Zoster Vaccines- Shingrix (1 of 2) Never done   COVID-19 Vaccine (5 - Pfizer series) 11/11/2020   TETANUS/TDAP  07/10/2021   Pneumonia Vaccine 66+ Years old  Completed   INFLUENZA VACCINE  Completed   HPV VACCINES  Aged Out   DEXA SCAN  Discontinued    Health Maintenance  Health Maintenance Due  Topic Date Due   Zoster Vaccines- Shingrix (1 of 2) Never done   COVID-19 Vaccine (5 - Pfizer series) 11/11/2020   TETANUS/TDAP  07/10/2021    Colorectal cancer screening: No longer required.   Mammogram status: No longer required due to age.  Bone Density status: n/a  Lung Cancer Screening: (Low Dose CT Chest recommended if Age 32-80 years, 30 pack-year currently smoking OR have quit w/in 15years.) does not qualify.   Lung Cancer Screening Referral: no  Additional Screening:  Hepatitis C Screening: does not qualify;   Vision Screening: Recommended annual ophthalmology exams for early detection of glaucoma and other disorders  of the eye. Is the patient up to date with their annual eye exam?  Yes  Who is the provider or what is the name of the office in which the patient attends annual eye exams? [facility] If pt is not  established with a provider, would they like to be referred to a provider to establish care? No .   Dental Screening: Recommended annual dental exams for proper oral hygiene  Community Resource Referral / Chronic Care Management: CRR required this visit?  No   CCM required this visit?  No      Plan:     I have personally reviewed and noted the following in the patient's chart:   Medical and social history Use of alcohol, tobacco or illicit drugs  Current medications and supplements including opioid prescriptions. Patient is not currently taking opioid prescriptions. Functional ability and status Nutritional status Physical activity Advanced directives List of other physicians Hospitalizations, surgeries, and ER visits in previous 12 months Vitals Screenings to include cognitive, depression, and falls Referrals and appointments  In addition, I have reviewed and discussed with patient certain preventive protocols, quality metrics, and best practice recommendations. A written personalized care plan for preventive services as well as general preventive health recommendations were provided to patient.     Kellie Simmering, LPN   [id number]   Nurse Notes: Marlynn Perking

## 2022-03-22 DIAGNOSIS — Z681 Body mass index (BMI) 19 or less, adult: Secondary | ICD-10-CM | POA: Diagnosis not present

## 2022-03-22 DIAGNOSIS — D649 Anemia, unspecified: Secondary | ICD-10-CM | POA: Diagnosis not present

## 2022-03-22 DIAGNOSIS — Z008 Encounter for other general examination: Secondary | ICD-10-CM | POA: Diagnosis not present

## 2022-03-22 DIAGNOSIS — N182 Chronic kidney disease, stage 2 (mild): Secondary | ICD-10-CM | POA: Diagnosis not present

## 2022-03-22 DIAGNOSIS — R636 Underweight: Secondary | ICD-10-CM | POA: Diagnosis not present

## 2022-03-22 DIAGNOSIS — F17211 Nicotine dependence, cigarettes, in remission: Secondary | ICD-10-CM | POA: Diagnosis not present

## 2022-08-28 DIAGNOSIS — L821 Other seborrheic keratosis: Secondary | ICD-10-CM | POA: Diagnosis not present

## 2022-08-28 DIAGNOSIS — L718 Other rosacea: Secondary | ICD-10-CM | POA: Diagnosis not present

## 2022-08-28 DIAGNOSIS — K130 Diseases of lips: Secondary | ICD-10-CM | POA: Diagnosis not present

## 2022-09-17 ENCOUNTER — Encounter: Payer: No Typology Code available for payment source | Admitting: Family Medicine

## 2022-11-21 ENCOUNTER — Encounter: Payer: Self-pay | Admitting: Family Medicine

## 2022-11-21 ENCOUNTER — Ambulatory Visit (INDEPENDENT_AMBULATORY_CARE_PROVIDER_SITE_OTHER): Payer: No Typology Code available for payment source | Admitting: Family Medicine

## 2022-11-21 VITALS — BP 136/70 | HR 75 | Temp 97.9°F | Ht 64.17 in | Wt 110.2 lb

## 2022-11-21 DIAGNOSIS — D649 Anemia, unspecified: Secondary | ICD-10-CM | POA: Diagnosis not present

## 2022-11-21 DIAGNOSIS — E559 Vitamin D deficiency, unspecified: Secondary | ICD-10-CM

## 2022-11-21 DIAGNOSIS — F5102 Adjustment insomnia: Secondary | ICD-10-CM | POA: Diagnosis not present

## 2022-11-21 DIAGNOSIS — E538 Deficiency of other specified B group vitamins: Secondary | ICD-10-CM | POA: Insufficient documentation

## 2022-11-21 DIAGNOSIS — L719 Rosacea, unspecified: Secondary | ICD-10-CM | POA: Diagnosis not present

## 2022-11-21 LAB — CBC WITH DIFFERENTIAL/PLATELET
Basophils Absolute: 0 10*3/uL (ref 0.0–0.1)
Basophils Relative: 0.6 % (ref 0.0–3.0)
Eosinophils Absolute: 0.1 10*3/uL (ref 0.0–0.7)
Eosinophils Relative: 2 % (ref 0.0–5.0)
HCT: 34.8 % — ABNORMAL LOW (ref 36.0–46.0)
Hemoglobin: 11.3 g/dL — ABNORMAL LOW (ref 12.0–15.0)
Lymphocytes Relative: 35 % (ref 12.0–46.0)
Lymphs Abs: 2 10*3/uL (ref 0.7–4.0)
MCHC: 32.4 g/dL (ref 30.0–36.0)
MCV: 86.3 fl (ref 78.0–100.0)
Monocytes Absolute: 0.5 10*3/uL (ref 0.1–1.0)
Monocytes Relative: 8.4 % (ref 3.0–12.0)
Neutro Abs: 3.1 10*3/uL (ref 1.4–7.7)
Neutrophils Relative %: 54 % (ref 43.0–77.0)
Platelets: 234 10*3/uL (ref 150.0–400.0)
RBC: 4.03 Mil/uL (ref 3.87–5.11)
RDW: 14.1 % (ref 11.5–15.5)
WBC: 5.7 10*3/uL (ref 4.0–10.5)

## 2022-11-21 LAB — BASIC METABOLIC PANEL
BUN: 19 mg/dL (ref 6–23)
CO2: 21 mEq/L (ref 19–32)
Calcium: 9 mg/dL (ref 8.4–10.5)
Chloride: 107 mEq/L (ref 96–112)
Creatinine, Ser: 0.75 mg/dL (ref 0.40–1.20)
GFR: 72.05 mL/min (ref >60.00)
Glucose, Bld: 82 mg/dL (ref 70–99)
Potassium: 4.4 mEq/L (ref 3.5–5.1)
Sodium: 141 mEq/L (ref 135–145)

## 2022-11-21 LAB — VITAMIN D 25 HYDROXY (VIT D DEFICIENCY, FRACTURES): VITD: 26.64 ng/mL — ABNORMAL LOW (ref 30.00–100.00)

## 2022-11-21 LAB — VITAMIN B12: Vitamin B-12: 689 pg/mL (ref 211–911)

## 2022-11-21 NOTE — Progress Notes (Signed)
Established Patient Office Visit  Subjective   Patient ID: Christina Oliver, female    DOB: October 06, 1933  Age: 89 y.o. MRN: [id number]  Chief Complaint  Patient presents with   Annual Exam    HPI   Christina Oliver is seen for yearly medical follow-up.  Her husband passed away last 11/11/22.  He had multiple medical problems and had been essentially bedbound for some time.  She had a sister that died in Jul 13, 2022 who is in her early 30s.  Naturally, she has had some difficulties this year adjusting to those deaths.  Overall though doing fairly well.  She discussed the following items today:  -Intermittent chest congestion.  She states she had a sinus infection over the winter and this was followed by some chronic sinus congestion and intermittent dry mouth.  Symptoms cleared at this time.  No chronic sinusitis symptoms now  -Saw dermatologist this past winter and reportedly had basal cell carcinoma removed tip of nose.  No other concerning lesions at this time  -Diffuse pruritus upper back bilaterally.  Uses Cetaphil on her face intermittently  -Has had some erythematous papules on her face and was diagnosed with rosacea per dermatology.  She uses MetroGel intermittently and symptoms are relatively mild  -Intermittent sleep disruption.  Usually falls asleep but frequently wakes up after 4 hours and difficulty getting back to sleep.  No regular use of alcohol at night.  Generally avoids daytime naps.  -Has had some urine incontinence with combination of occasional mild stress incontinence with things like walking and occasional mild urgency.  Usually only gets up about once at night to urinate.  -Occasional pellet-like stools.  She does drink a lot of water and eats a lot of natural sources of fiber.  Avoids processed foods.  -History of B12 deficiency.  Currently on oral replacement with B12 1000 mcg daily.  Also has history of vitamin D deficiency and takes vitamin D replacement though  sporadically  -She has history of chronic mild normocytic anemia with hemoglobin usually 11 range.  Past Medical History:  Diagnosis Date   CONTUSION, LEG 01/30/2010   SCIATICA, ACUTE 10/05/2008   Past Surgical History:  Procedure Laterality Date   VEIN SURGERY     varicose, right leg    reports that she has never smoked. She has never used smokeless tobacco. She reports current alcohol use. She reports that she does not use drugs. family history includes Cancer in her son. Allergies  Allergen Reactions   Sulfadiazine     As a child    Review of Systems  Constitutional:  Negative for chills, fever, malaise/fatigue and weight loss.  Eyes:  Negative for blurred vision.  Respiratory:  Negative for shortness of breath.   Cardiovascular:  Negative for chest pain.  Gastrointestinal:  Positive for constipation. Negative for abdominal pain.  Genitourinary:  Positive for urgency.  Neurological:  Negative for dizziness, weakness and headaches.  Psychiatric/Behavioral:  Negative for depression. The patient has insomnia.       Objective:     BP 136/70 (BP Location: Left Arm, Patient Position: Sitting, Cuff Size: Normal)   Pulse 75   Temp 97.9 F (36.6 C) (Oral)   Ht 5' 4.17" (1.63 m)   Wt 110 lb 3.2 oz (50 kg)   SpO2 97%   BMI 18.81 kg/m  BP Readings from Last 3 Encounters:  11/21/22 136/70  03/20/22 122/70  09/13/21 138/68   Wt Readings from Last 3 Encounters:  11/21/22 110  lb 3.2 oz (50 kg)  03/20/22 109 lb 11.2 oz (49.8 kg)  02/07/22 109 lb 4.8 oz (49.6 kg)      Physical Exam Constitutional:      Appearance: She is well-developed.  Eyes:     Pupils: Pupils are equal, round, and reactive to light.  Neck:     Thyroid: No thyromegaly.     Vascular: No JVD.  Cardiovascular:     Rate and Rhythm: Normal rate and regular rhythm.     Heart sounds:     No gallop.  Pulmonary:     Effort: Pulmonary effort is normal. No respiratory distress.     Breath sounds: Normal  breath sounds. No wheezing or rales.  Musculoskeletal:     Cervical back: Neck supple.     Right lower leg: No edema.     Left lower leg: No edema.  Skin:    Comments: has a couple small erythematous papules on her malar region.  No pustules.  Neurological:     Mental Status: She is alert.      No results found for any visits on 11/21/22.  Last CBC Lab Results  Component Value Date   WBC 5.4 09/13/2021   HGB 11.2 (L) 09/13/2021   HCT 34.1 (L) 09/13/2021   MCV 85.9 09/13/2021   RDW 14.1 09/13/2021   PLT 220.0 09/13/2021   Last metabolic panel Lab Results  Component Value Date   GLUCOSE 84 09/13/2021   NA 140 09/13/2021   K 4.4 09/13/2021   CL 106 09/13/2021   CO2 26 09/13/2021   BUN 22 09/13/2021   CREATININE 0.79 09/13/2021   CALCIUM 9.4 09/13/2021   PROT 6.5 09/12/2020   ALBUMIN 4.2 09/12/2020   BILITOT 0.8 09/12/2020   ALKPHOS 75 09/12/2020   AST 18 09/12/2020   ALT 13 09/12/2020   Last thyroid functions Lab Results  Component Value Date   TSH 0.72 09/13/2021   Last vitamin D Lab Results  Component Value Date   VD25OH 31.86 09/13/2021   Last vitamin B12 and Folate Lab Results  Component Value Date   VITAMINB12 1,490 (H) 09/13/2021      The ASCVD Risk score (Arnett DK, et al., 2019) failed to calculate for the following reasons:   The 2019 ASCVD risk score is only valid for ages 83 to 79    Assessment & Plan:   Problem List Items Addressed This Visit       Unprioritized   Vitamin B 12 deficiency - Primary   Relevant Orders   Vitamin B12   Rosacea   Normocytic anemia   Relevant Orders   CBC with Differential/Platelet   Basic metabolic panel   Other Visit Diagnoses     Transient insomnia       Vitamin D deficiency       Relevant Orders   VITAMIN D 25 Hydroxy (Vit-D Deficiency, Fractures)     -Discussed several issues as above.  We discussed sleep hygiene.  Handout given.  Avoid medications such as Benadryl.  If symptoms severe  could consider options such as low-dose trazodone.  She prefers to avoid medication  -Continue as needed MetroGel topically for rosacea.  Did discuss other potential options if this becomes more severe  -Continue lifestyle modification regarding her intermittent mild constipation with plenty of fluids and fiber.  Recommend a least 25 g fiber per day  -Check labs as above including CBC, vitamin D level, and B12  -Discussed urinary urgency and stress  incontinence.  Consider Kegel exercises.  She prefers to avoid medication such as Gemtesa or Myrbetriq currently for her mild urine urgency.  No follow-ups on file.    Evelena Peat, MD

## 2022-11-28 ENCOUNTER — Other Ambulatory Visit: Payer: Self-pay | Admitting: Family Medicine

## 2022-11-28 DIAGNOSIS — Z1231 Encounter for screening mammogram for malignant neoplasm of breast: Secondary | ICD-10-CM

## 2022-11-30 ENCOUNTER — Ambulatory Visit: Payer: No Typology Code available for payment source

## 2022-11-30 ENCOUNTER — Ambulatory Visit
Admission: RE | Admit: 2022-11-30 | Discharge: 2022-11-30 | Disposition: A | Discharge status: Home / Self Care | Payer: No Typology Code available for payment source | Source: Ambulatory Visit | Attending: Family Medicine | Admitting: Family Medicine

## 2022-11-30 DIAGNOSIS — Z1231 Encounter for screening mammogram for malignant neoplasm of breast: Secondary | ICD-10-CM

## 2022-12-17 DIAGNOSIS — H2513 Age-related nuclear cataract, bilateral: Secondary | ICD-10-CM | POA: Diagnosis not present

## 2023-02-03 ENCOUNTER — Encounter: Payer: Self-pay | Admitting: Family Medicine

## 2023-03-18 ENCOUNTER — Telehealth: Payer: Self-pay | Admitting: Family Medicine

## 2023-03-18 NOTE — Telephone Encounter (Signed)
error 

## 2023-04-02 DIAGNOSIS — Z681 Body mass index (BMI) 19 or less, adult: Secondary | ICD-10-CM | POA: Diagnosis not present

## 2023-04-02 DIAGNOSIS — Z008 Encounter for other general examination: Secondary | ICD-10-CM | POA: Diagnosis not present

## 2023-04-09 DIAGNOSIS — L718 Other rosacea: Secondary | ICD-10-CM | POA: Diagnosis not present

## 2023-04-09 DIAGNOSIS — D225 Melanocytic nevi of trunk: Secondary | ICD-10-CM | POA: Diagnosis not present

## 2023-04-09 DIAGNOSIS — L820 Inflamed seborrheic keratosis: Secondary | ICD-10-CM | POA: Diagnosis not present

## 2023-04-09 DIAGNOSIS — K130 Diseases of lips: Secondary | ICD-10-CM | POA: Diagnosis not present

## 2023-04-09 DIAGNOSIS — L821 Other seborrheic keratosis: Secondary | ICD-10-CM | POA: Diagnosis not present

## 2023-04-24 ENCOUNTER — Ambulatory Visit: Payer: No Typology Code available for payment source

## 2023-04-24 VITALS — BP 118/62 | HR 76 | Temp 98.2°F | Ht 64.0 in | Wt 108.0 lb

## 2023-04-24 DIAGNOSIS — Z0000 Encounter for general adult medical examination without abnormal findings: Secondary | ICD-10-CM | POA: Diagnosis not present

## 2023-04-24 NOTE — Patient Instructions (Addendum)
Christina Oliver , Thank you for taking time to come for your Medicare Wellness Visit. I appreciate your ongoing commitment to your health goals. Please review the following plan we discussed and let me know if I can assist you in the future.   Referrals/Orders/Follow-Ups/Clinician Recommendations:   This is a list of the screening recommended for you and due dates:  Health Maintenance  Topic Date Due   Zoster (Shingles) Vaccine (1 of 2) Never done   DTaP/Tdap/Td vaccine (3 - Td or Tdap) 07/10/2021   COVID-19 Vaccine (5 - 2023-24 season) 02/17/2023   Medicare Annual Wellness Visit  04/23/2024   Pneumonia Vaccine  Completed   Flu Shot  Completed   HPV Vaccine  Aged Out   DEXA scan (bone density measurement)  Discontinued    Advanced directives: (Copy Requested) Please bring a copy of your health care power of attorney and living will to the office to be added to your chart at your convenience.  Next Medicare Annual Wellness Visit scheduled for next year: Yes

## 2023-04-24 NOTE — Progress Notes (Signed)
Subjective:   Christina Oliver is a 89 y.o. female who presents for Medicare Annual (Subsequent) preventive examination.  Visit Complete: In person    Cardiac Risk Factors include: advanced age (>37men, >54 women)     Objective:    Today's Vitals   04/24/23 1339  BP: 118/62  Pulse: 76  Temp: 98.2 F (36.8 C)  TempSrc: Oral  SpO2: 97%  Weight: 108 lb (49 kg)  Height: 5\' 4"  (1.626 m)   Body mass index is 18.54 kg/m.     04/24/2023    1:56 PM 03/20/2022    9:31 AM 03/07/2021    9:43 AM 03/02/2020   11:22 AM 01/21/2018   10:43 AM 01/18/2017    9:43 AM  Advanced Directives  Does Patient Have a Medical Advance Directive? Yes Yes Yes Yes Yes Yes  Type of Estate agent of [place];Living will Healthcare Power of [place];Living will Healthcare Power of [employer] of [place];Living will    Does patient want to make changes to medical advance directive?    No - Patient declined    Copy of Healthcare Power of Attorney in Chart? No - copy requested No - copy requested No - copy requested No - copy requested      Current Medications (verified) Outpatient Encounter Medications as of 04/24/2023  Medication Sig   PFIZER-BIONT COVID-19 VAC-TRIS SUSP injection    No facility-administered encounter medications on file as of 04/24/2023.    Allergies (verified) Sulfadiazine   History: Past Medical History:  Diagnosis Date   CONTUSION, LEG 01/30/2010   SCIATICA, ACUTE 10/05/2008   Past Surgical History:  Procedure Laterality Date   VEIN SURGERY     varicose, right leg   Family History  Problem Relation Age of Onset   Cancer Son        leukemia   Social History   Socioeconomic History   Marital status: Married    Spouse name: Not on file   Number of children: Not on file   Years of education: Not on file   Highest education level: Not on file  Occupational History   Not on file  Tobacco Use   Smoking status: Never   Smokeless  tobacco: Never  Vaping Use   Vaping status: Never Used  Substance and Sexual Activity   Alcohol use: Yes   Drug use: No   Sexual activity: Not on file  Other Topics Concern   Not on file  Social History Narrative   Not on file   Social Determinants of Health   Financial Resource Strain: Low Risk  (04/24/2023)   Overall Financial Resource Strain (CARDIA)    Difficulty of Paying Living Expenses: Not hard at all  Food Insecurity: No Food Insecurity (04/24/2023)   Hunger Vital Sign    Worried About Running Out of Food in the Last Year: Never true    Ran Out of Food in the Last Year: Never true  Transportation Needs: No Transportation Needs (04/24/2023)   PRAPARE - Administrator, Civil Service (Medical): No    Lack of Transportation (Non-Medical): No  Physical Activity: Sufficiently Active (04/24/2023)   Exercise Vital Sign    Days of Exercise per Week: 7 days    Minutes of Exercise per Session: 60 min  Stress: No Stress Concern Present (04/24/2023)   [employer] of Occupational Health - Occupational Stress Questionnaire    Feeling of Stress : Not at all  Social Connections: Moderately Integrated (  04/24/2023)   Social Connection and Isolation Panel [NHANES]    Frequency of Communication with Friends and Family: More than three times a week    Frequency of Social Gatherings with Friends and Family: More than three times a week    Attends Religious Services: More than 4 times per year    Active Member of [employer] or Organizations: Yes    Attends Banker Meetings: More than 4 times per year    Marital Status: Widowed    Tobacco Counseling Counseling given: Not Answered   Clinical Intake:  Pre-visit preparation completed: Yes  Pain : No/denies pain     BMI - recorded: 18.54 Nutritional Status: BMI <19  Underweight Nutritional Risks: None Diabetes: No  How often do you need to have someone help you when you read instructions, pamphlets, or other  written materials from your doctor or pharmacy?: 1 - Never  Interpreter Needed?: No  Information entered by :: Theresa Mulligan LPN   Activities of Daily Living    04/24/2023    1:54 PM  In your present state of health, do you have any difficulty performing the following activities:  Hearing? 0  Vision? 0  Difficulty concentrating or making decisions? 0  Walking or climbing stairs? 0  Dressing or bathing? 0  Doing errands, shopping? 0  Preparing Food and eating ? N  Using the Toilet? N  In the past six months, have you accidently leaked urine? N  Do you have problems with loss of bowel control? N  Managing your Medications? N  Managing your Finances? N  Housekeeping or managing your Housekeeping? N    Patient Care Team: Kristian Covey, MD as PCP - General  Indicate any recent Medical Services you may have received from other than [facility] providers in the past year (date may be approximate).     Assessment:   This is a routine wellness examination for Christina Oliver.  Hearing/Vision screen Hearing Screening - Comments:: Denies hearing difficulties   Vision Screening - Comments:: Wears rx glasses - up to date with routine eye exams with  [facility]   Goals Addressed               This Visit's Progress     Complete personal projects (pt-stated)         Depression Screen    04/24/2023    1:41 PM 03/20/2022    9:32 AM 08/04/2021   10:37 AM 03/07/2021    9:41 AM 03/02/2020   11:25 AM 09/11/2019    1:33 PM 07/28/2018    9:06 AM  PHQ 2/9 Scores  PHQ - 2 Score 0 0 0 0 0 0 0  PHQ- 9 Score     0  0    Fall Risk    04/24/2023    1:55 PM 03/20/2022    9:32 AM 08/04/2021   10:37 AM 03/07/2021    9:44 AM 03/02/2020   11:24 AM  Fall Risk   Falls in the past year? 0 0 0 0 0  Number falls in past yr: 0 0  0 0  Injury with Fall? 0 0  0 0  Risk for fall due to : No Fall Risks No Fall Risks   No Fall Risks  Follow up Falls prevention discussed Falls prevention  discussed;Falls evaluation completed;Education provided  Falls prevention discussed Falls evaluation completed;Falls prevention discussed    MEDICARE RISK AT HOME: Medicare Risk at Home Any stairs in or around the  home?: Yes If so, are there any without handrails?: No Home free of loose throw rugs in walkways, pet beds, electrical cords, etc?: Yes Adequate lighting in your home to reduce risk of falls?: Yes Life alert?: No Use of a cane, walker or w/c?: No Grab bars in the bathroom?: No Shower chair or bench in shower?: Yes Elevated toilet seat or a handicapped toilet?: Yes  TIMED UP AND GO:  Was the test performed?  Yes  Length of time to ambulate 10 feet: 10 sec Gait steady and fast without use of assistive device    Cognitive Function:    01/21/2018   11:01 AM  MMSE - Mini Mental State Exam  Not completed: --        04/24/2023    1:56 PM 03/20/2022    9:33 AM 03/07/2021    9:45 AM 01/18/2017    9:49 AM  6CIT Screen  What Year? 0 points 0 points 0 points 0 points  What month? 0 points 0 points 0 points 0 points  What time? 0 points 0 points 0 points 0 points  Count back from 20 0 points 0 points 0 points 0 points  Months in reverse 0 points 0 points 0 points 0 points  Repeat phrase 0 points 2 points 0 points 0 points  Total Score 0 points 2 points 0 points 0 points    Immunizations Immunization History  Administered Date(s) Administered   Fluad Quad(high Dose 65+) 03/08/2021, 03/20/2022, 04/03/2023   Influenza Split 05/11/2011, 04/11/2012   Influenza Whole 06/29/2010   Influenza, High Dose Seasonal PF 04/10/2018, 03/31/2019   Influenza,inj,Quad PF,6+ Mos 04/08/2014   Influenza-Unspecified 03/18/2013, 04/19/2015, 03/18/2016, 02/04/2017   PFIZER(Purple Top)SARS-COV-2 Vaccination 08/11/2019, 09/01/2019, 04/04/2020, 09/16/2020   Pneumococcal Conjugate-13 07/15/2015   Pneumococcal Polysaccharide-23 06/26/2010   Td 06/18/2002   Tdap 07/11/2011    TDAP status: Due,  Education has been provided regarding the importance of this vaccine. Advised may receive this vaccine at local pharmacy or Health Dept. Aware to provide a copy of the vaccination record if obtained from local pharmacy or Health Dept. Verbalized acceptance and understanding.  Flu Vaccine status: Up to date  Pneumococcal vaccine status: Up to date  Covid-19 vaccine status: Declined, Education has been provided regarding the importance of this vaccine but patient still declined. Advised may receive this vaccine at local pharmacy or Health Dept.or vaccine clinic. Aware to provide a copy of the vaccination record if obtained from local pharmacy or Health Dept. Verbalized acceptance and understanding.  Qualifies for Shingles Vaccine? Yes   Zostavax completed No   Shingrix Completed?: No.    Education has been provided regarding the importance of this vaccine. Patient has been advised to call insurance company to determine out of pocket expense if they have not yet received this vaccine. Advised may also receive vaccine at local pharmacy or Health Dept. Verbalized acceptance and understanding.  Screening Tests Health Maintenance  Topic Date Due   Zoster Vaccines- Shingrix (1 of 2) Never done   DTaP/Tdap/Td (3 - Td or Tdap) 07/10/2021   COVID-19 Vaccine (5 - 2023-24 season) 02/17/2023   Medicare Annual Wellness (AWV)  04/23/2024   Pneumonia Vaccine 82+ Years old  Completed   INFLUENZA VACCINE  Completed   HPV VACCINES  Aged Out   DEXA SCAN  Discontinued    Health Maintenance  Health Maintenance Due  Topic Date Due   Zoster Vaccines- Shingrix (1 of 2) Never done   DTaP/Tdap/Td (3 - Td or  Tdap) 07/10/2021   COVID-19 Vaccine (5 - 2023-24 season) 02/17/2023     Additional Screening:    Vision Screening: Recommended annual ophthalmology exams for early detection of glaucoma and other disorders of the eye. Is the patient up to date with their annual eye exam?  Yes  Who is the provider or  what is the name of the office in which the patient attends annual eye exams? [facility] If pt is not established with a provider, would they like to be referred to a provider to establish care? No .   Dental Screening: Recommended annual dental exams for proper oral hygiene     Community Resource Referral / Chronic Care Management:  CRR required this visit?  No   CCM required this visit?  No     Plan:     I have personally reviewed and noted the following in the patient's chart:   Medical and social history Use of alcohol, tobacco or illicit drugs  Current medications and supplements including opioid prescriptions. Patient is not currently taking opioid prescriptions. Functional ability and status Nutritional status Physical activity Advanced directives List of other physicians Hospitalizations, surgeries, and ER visits in previous 12 months Vitals Screenings to include cognitive, depression, and falls Referrals and appointments  In addition, I have reviewed and discussed with patient certain preventive protocols, quality metrics, and best practice recommendations. A written personalized care plan for preventive services as well as general preventive health recommendations were provided to patient.     Tillie Rung, LPN   [id number]/02/[id number]   After Visit Summary: Given  Nurse Notes: None

## 2023-10-24 ENCOUNTER — Other Ambulatory Visit: Payer: Self-pay | Admitting: Family Medicine

## 2023-10-24 DIAGNOSIS — Z1231 Encounter for screening mammogram for malignant neoplasm of breast: Secondary | ICD-10-CM

## 2023-11-22 ENCOUNTER — Ambulatory Visit (INDEPENDENT_AMBULATORY_CARE_PROVIDER_SITE_OTHER): Payer: No Typology Code available for payment source | Admitting: Family Medicine

## 2023-11-22 ENCOUNTER — Encounter: Payer: Self-pay | Admitting: Family Medicine

## 2023-11-22 VITALS — BP 142/70 | HR 79 | Temp 97.9°F | Ht 63.39 in | Wt 108.7 lb

## 2023-11-22 DIAGNOSIS — Z0000 Encounter for general adult medical examination without abnormal findings: Secondary | ICD-10-CM | POA: Diagnosis not present

## 2023-11-22 DIAGNOSIS — E538 Deficiency of other specified B group vitamins: Secondary | ICD-10-CM | POA: Diagnosis not present

## 2023-11-22 DIAGNOSIS — D649 Anemia, unspecified: Secondary | ICD-10-CM | POA: Diagnosis not present

## 2023-11-22 DIAGNOSIS — E559 Vitamin D deficiency, unspecified: Secondary | ICD-10-CM

## 2023-11-22 LAB — CBC WITH DIFFERENTIAL/PLATELET
Basophils Absolute: 0 10*3/uL (ref 0.0–0.1)
Basophils Relative: 0.7 % (ref 0.0–3.0)
Eosinophils Absolute: 0.1 10*3/uL (ref 0.0–0.7)
Eosinophils Relative: 2.5 % (ref 0.0–5.0)
HCT: 34.9 % — ABNORMAL LOW (ref 36.0–46.0)
Hemoglobin: 11.5 g/dL — ABNORMAL LOW (ref 12.0–15.0)
Lymphocytes Relative: 38.3 % (ref 12.0–46.0)
Lymphs Abs: 2.1 10*3/uL (ref 0.7–4.0)
MCHC: 33.1 g/dL (ref 30.0–36.0)
MCV: 85.1 fl (ref 78.0–100.0)
Monocytes Absolute: 0.5 10*3/uL (ref 0.1–1.0)
Monocytes Relative: 8.7 % (ref 3.0–12.0)
Neutro Abs: 2.7 10*3/uL (ref 1.4–7.7)
Neutrophils Relative %: 49.8 % (ref 43.0–77.0)
Platelets: 225 10*3/uL (ref 150.0–400.0)
RBC: 4.1 Mil/uL (ref 3.87–5.11)
RDW: 13.5 % (ref 11.5–15.5)
WBC: 5.4 10*3/uL (ref 4.0–10.5)

## 2023-11-22 LAB — BASIC METABOLIC PANEL WITH GFR
BUN: 17 mg/dL (ref 6–23)
CO2: 27 meq/L (ref 19–32)
Calcium: 9.3 mg/dL (ref 8.4–10.5)
Chloride: 109 meq/L (ref 96–112)
Creatinine, Ser: 0.75 mg/dL (ref 0.40–1.20)
GFR: 71.54 mL/min (ref >60.00)
Glucose, Bld: 88 mg/dL (ref 70–99)
Potassium: 4.3 meq/L (ref 3.5–5.1)
Sodium: 142 meq/L (ref 135–145)

## 2023-11-22 LAB — VITAMIN D 25 HYDROXY (VIT D DEFICIENCY, FRACTURES): VITD: 15.44 ng/mL — ABNORMAL LOW (ref 30.00–100.00)

## 2023-11-22 NOTE — Progress Notes (Signed)
 Established Patient Office Visit  Subjective   Patient ID: Christina Oliver, female    DOB: 03-Oct-1933  Age: 89 y.o. MRN: [id number]  Chief Complaint  Patient presents with   Annual Exam    HPI   Ms. Boan is seen for annual well visit.  Very health-conscious.  She stays extremely active.  She push mows 2 to 3 acres weekly.  No recent falls.  Eats mostly plant-based diet.  Does eat some eggs and other dairy.  Does have history of B12 deficiency.  Also history of vitamin D  deficiency.  She declines further DEXA scans.  She has history of chronic normocytic anemia with hemoglobin 11 range.  She states she had tetanus at pharmacy just within the past couple months but does not have dates.  No history of RSV vaccine or Shingrix.  She is considering both.  Pneumonia vaccines up-to-date.  Aged out of further colonoscopies.  She is scheduled for repeat mammogram.  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  She still does some editing work   Education officer, community smoked.  Widowed.  Husband passed away over a year ago following lengthy illness with multiple medical problems.  Past Medical History:  Diagnosis Date   CONTUSION, LEG 01/30/2010   SCIATICA, ACUTE 10/05/2008   Past Surgical History:  Procedure Laterality Date   VEIN SURGERY     varicose, right leg    reports that she has never smoked. She has never used smokeless tobacco. She reports current alcohol use. She reports that she does not use drugs. family history includes Cancer in her son. Allergies  Allergen Reactions   Sulfadiazine     As a child   now. Review of Systems  Constitutional:  Negative for malaise/fatigue.  Eyes:  Negative for blurred vision.  Respiratory:  Negative for shortness of breath.   Cardiovascular:  Negative for chest pain.  Neurological:  Negative for dizziness, weakness and headaches.      Objective:     BP (!) 142/70 (BP Location: Left Arm, Cuff Size:  Normal)   Pulse 79   Temp 97.9 F (36.6 C) (Oral)   Ht 5' 3.39" (1.61 m)   Wt 108 lb 11.2 oz (49.3 kg)   SpO2 96%   BMI 19.02 kg/m  BP Readings from Last 3 Encounters:  11/22/23 (!) 142/70  04/24/23 118/62  11/21/22 136/70   Wt Readings from Last 3 Encounters:  11/22/23 108 lb 11.2 oz (49.3 kg)  04/24/23 108 lb (49 kg)  11/21/22 110 lb 3.2 oz (50 kg)      Physical Exam Vitals reviewed.  Constitutional:      General: She is not in acute distress.    Appearance: She is well-developed. She is not ill-appearing.  Eyes:     Pupils: Pupils are equal, round, and reactive to light.  Neck:     Thyroid : No thyromegaly.     Vascular: No JVD.  Cardiovascular:     Rate and Rhythm: Normal rate and regular rhythm.     Heart sounds:     No gallop.  Pulmonary:     Effort: Pulmonary effort is normal. No respiratory distress.     Breath sounds: Normal breath sounds. No wheezing or rales.  Musculoskeletal:     Cervical back: Neck supple.  Neurological:     Mental Status: She is alert.      No results found for any visits on 11/22/23.    The ASCVD Risk score (  Arnett DK, et al., 2019) failed to calculate for the following reasons:   The 2019 ASCVD risk score is only valid for ages 60 to 21    Assessment & Plan:   Physical exam.  Generally healthy 89 year old female.  We discussed multiple screenings today and she declines further DEXA scans.  She states she would not consider medical therapies even if this were low.  She does try to get adequate calcium and vitamin D .  Does have a history of B12 deficiency and not recently taking B12 supplement.  She does eat some eggs and dairy but no meat.  -We discussed continuing regular weightbearing exercise and getting adequate daily calcium and vitamin D  - check B12 and vitamin D  levels with prior history of deficiency - She has history of normocytic anemia and discussed rechecking CBC - She does have repeat mammogram scheduled - Aged  out of further colonoscopy screening - Recommend annual flu vaccine - We did also recommend she consider Shingrix and RSV vaccine at some point this year   No follow-ups on file.    Glean Lamy, MD

## 2023-11-22 NOTE — Patient Instructions (Signed)
 Monitor blood pressure at home and be in touch if consistently > 140/90  Consider Shingrix and RSV vaccines at some point this year.

## 2023-11-27 ENCOUNTER — Ambulatory Visit (INDEPENDENT_AMBULATORY_CARE_PROVIDER_SITE_OTHER): Admitting: Family Medicine

## 2023-11-27 ENCOUNTER — Encounter: Payer: Self-pay | Admitting: Family Medicine

## 2023-11-27 VITALS — BP 138/72 | HR 69 | Temp 97.7°F | Wt 106.3 lb

## 2023-11-27 DIAGNOSIS — R030 Elevated blood-pressure reading, without diagnosis of hypertension: Secondary | ICD-10-CM

## 2023-11-27 DIAGNOSIS — K219 Gastro-esophageal reflux disease without esophagitis: Secondary | ICD-10-CM

## 2023-11-27 DIAGNOSIS — R3915 Urgency of urination: Secondary | ICD-10-CM | POA: Diagnosis not present

## 2023-11-27 DIAGNOSIS — M542 Cervicalgia: Secondary | ICD-10-CM

## 2023-11-27 NOTE — Progress Notes (Signed)
 Established Patient Office Visit  Subjective   Patient ID: Christina Oliver, female    DOB: 08/29/1933  Age: 89 y.o. MRN: [id number]  Chief Complaint  Patient presents with   Medical Management of Chronic Issues    HPI   Christina Oliver is here today to discuss several items as follows  Recent increased blood pressure here.  She brings in her cuff for comparison.  Most of her home readings have been fairly well-controlled..  With her cuff obtained reading here today in office of 133/64 and by my check left arm seated after rest 138/72-she is very active physically and rarely eats out and eats essentially no processed food.  Very conscious regarding sodium.  No regular nonsteroidal use.  Second issue is history of GERD intermittently.  Symptoms are very intermittent.  No recent dysphagia.  She tries to avoid regular medications.  She has had some recent symptoms when sitting at desk where she has somewhat of a stinging sensation in the lower neck region.  No radiculitis symptoms.  No upper extremity numbness or weakness.  Symptoms are usually very transient  Sometimes has low volume bowel movement and sometimes softer stools.  She thinks some of this may be related to not eating a lot of volume at times.  May not be getting sufficient fiber.  Has recently had some urine urgency.  No burning with urination.  She feels like this is more of a nuisance than anything.  She is not interested in looking at medication options.  Does not drink a lot of caffeine  Past Medical History:  Diagnosis Date   CONTUSION, LEG 01/30/2010   SCIATICA, ACUTE 10/05/2008   Past Surgical History:  Procedure Laterality Date   VEIN SURGERY     varicose, right leg    reports that she has never smoked. She has never used smokeless tobacco. She reports current alcohol use. She reports that she does not use drugs. family history includes Cancer in her son. Allergies  Allergen Reactions   Sulfadiazine     As a  child    Review of Systems  Constitutional:  Negative for chills, fever and malaise/fatigue.  Eyes:  Negative for blurred vision.  Respiratory:  Negative for shortness of breath.   Cardiovascular:  Negative for chest pain.  Genitourinary:  Positive for urgency. Negative for dysuria, flank pain and hematuria.  Neurological:  Negative for dizziness, weakness and headaches.      Objective:     BP 138/72 (BP Location: Left Arm, Cuff Size: Normal)   Pulse 69   Temp 97.7 F (36.5 C) (Oral)   Wt 106 lb 4.8 oz (48.2 kg)   SpO2 97%   BMI 18.60 kg/m  BP Readings from Last 3 Encounters:  11/27/23 138/72  11/22/23 (!) 142/70  04/24/23 118/62   Wt Readings from Last 3 Encounters:  11/27/23 106 lb 4.8 oz (48.2 kg)  11/22/23 108 lb 11.2 oz (49.3 kg)  04/24/23 108 lb (49 kg)      Physical Exam Vitals reviewed.  Constitutional:      Appearance: She is well-developed.  Eyes:     Pupils: Pupils are equal, round, and reactive to light.  Neck:     Thyroid : No thyromegaly.     Vascular: No JVD.  Cardiovascular:     Rate and Rhythm: Normal rate and regular rhythm.     Heart sounds:     No gallop.  Pulmonary:     Effort: Pulmonary effort is  normal. No respiratory distress.     Breath sounds: Normal breath sounds. No wheezing or rales.  Musculoskeletal:     Cervical back: Neck supple.  Neurological:     Mental Status: She is alert.      No results found for any visits on 11/27/23.  Last CBC Lab Results  Component Value Date   WBC 5.7 11/21/2022   HGB 11.3 (L) 11/21/2022   HCT 34.8 (L) 11/21/2022   MCV 86.3 11/21/2022   RDW 14.1 11/21/2022   PLT 234.0 11/21/2022   Last metabolic panel Lab Results  Component Value Date   GLUCOSE 82 11/21/2022   NA 141 11/21/2022   K 4.4 11/21/2022   CL 107 11/21/2022   CO2 21 11/21/2022   BUN 19 11/21/2022   CREATININE 0.75 11/21/2022   GFR 72.05 11/21/2022   CALCIUM 9.0 11/21/2022   PROT 6.5 09/12/2020   ALBUMIN 4.2  09/12/2020   BILITOT 0.8 09/12/2020   ALKPHOS 75 09/12/2020   AST 18 09/12/2020   ALT 13 09/12/2020   Last hemoglobin A1c No results found for: HGBA1C Last thyroid  functions Lab Results  Component Value Date   TSH 0.72 09/13/2021   Last vitamin D  Lab Results  Component Value Date   VD25OH 26.64 (L) 11/21/2022   Last vitamin B12 and Folate Lab Results  Component Value Date   VITAMINB12 689 11/21/2022      The ASCVD Risk score (Arnett DK, et al., 2019) failed to calculate for the following reasons:   The 2019 ASCVD risk score is only valid for ages 36 to 50    Assessment & Plan:   #1 mildly elevated blood pressure readings.  Blood pressure did improve significantly after rest today.  Recommend continued monitoring.  Continue low-sodium diet.  Continue regular exercise.  Be in touch if systolic consistently over 140.  #2 intermittent GERD symptoms.  No worrisome symptoms such as dysphagia or change of appetite  #3 intermittent lower cervical neck pain.  Suspect probably has some cervical radiculitis but no consistent upper extremity symptoms.  No upper extremity weakness or numbness.  Pain relatively mild and transient.  Observe for now.  #4 urine urgency.  No burning with urination.  Discussed potential medication options.  She is not interested in medication such as Gemtesa or anticholinergics at this point.  Continue low caffeine intake.   Glean Lamy, MD

## 2023-11-27 NOTE — Patient Instructions (Signed)
Monitor blood pressure and be in touch if consistently > 140 systolic (top number).    

## 2023-11-28 ENCOUNTER — Encounter: Payer: Self-pay | Admitting: Family Medicine

## 2023-11-28 LAB — VITAMIN B12: Vitamin B-12: 240 pg/mL (ref 211–911)

## 2023-11-29 ENCOUNTER — Ambulatory Visit: Payer: Self-pay | Admitting: Family Medicine

## 2023-11-29 DIAGNOSIS — E559 Vitamin D deficiency, unspecified: Secondary | ICD-10-CM

## 2023-11-29 MED ORDER — VITAMIN D (ERGOCALCIFEROL) 1.25 MG (50000 UNIT) PO CAPS: 50000.0000 [IU] | ORAL_CAPSULE | ORAL | 0 refills | Status: AC

## 2023-11-29 NOTE — Telephone Encounter (Signed)
 Please see result note

## 2023-12-01 ENCOUNTER — Encounter: Payer: Self-pay | Admitting: Family Medicine

## 2023-12-01 DIAGNOSIS — M79603 Pain in arm, unspecified: Secondary | ICD-10-CM

## 2023-12-02 ENCOUNTER — Ambulatory Visit
Admission: RE | Admit: 2023-12-02 | Discharge: 2023-12-02 | Disposition: A | Discharge status: Home / Self Care | Source: Ambulatory Visit | Attending: Family Medicine | Admitting: Family Medicine

## 2023-12-02 DIAGNOSIS — Z1231 Encounter for screening mammogram for malignant neoplasm of breast: Secondary | ICD-10-CM | POA: Diagnosis not present

## 2023-12-02 NOTE — Telephone Encounter (Signed)
 Copied from CRM [id number]. Topic: Referral - Question >> Dec 02, 2023  9:10 AM Cruzita Dopp I wrote: Reason for CRM: Katelynn from [facility] physical therapy, was calling to notify that they are  is unable to accept patient for therapy as they do not do anything with Medicaid plan and patient would need to be referred somewhere else.

## 2023-12-25 DIAGNOSIS — H2513 Age-related nuclear cataract, bilateral: Secondary | ICD-10-CM | POA: Diagnosis not present

## 2024-02-25 ENCOUNTER — Ambulatory Visit (INDEPENDENT_AMBULATORY_CARE_PROVIDER_SITE_OTHER): Admitting: Family Medicine

## 2024-02-25 ENCOUNTER — Encounter: Payer: Self-pay | Admitting: Family Medicine

## 2024-02-25 VITALS — BP 138/68 | HR 76 | Temp 97.8°F | Wt 108.5 lb

## 2024-02-25 DIAGNOSIS — M19041 Primary osteoarthritis, right hand: Secondary | ICD-10-CM | POA: Diagnosis not present

## 2024-02-25 DIAGNOSIS — M25511 Pain in right shoulder: Secondary | ICD-10-CM | POA: Diagnosis not present

## 2024-02-25 DIAGNOSIS — M19042 Primary osteoarthritis, left hand: Secondary | ICD-10-CM

## 2024-02-25 DIAGNOSIS — E559 Vitamin D deficiency, unspecified: Secondary | ICD-10-CM | POA: Diagnosis not present

## 2024-02-25 DIAGNOSIS — D649 Anemia, unspecified: Secondary | ICD-10-CM | POA: Diagnosis not present

## 2024-02-25 NOTE — Progress Notes (Signed)
 Established Patient Office Visit  Subjective   Patient ID: Christina Oliver, female    DOB: [birth year]  Age: 89 y.o. MRN: [id number]  Chief Complaint  Patient presents with   Shoulder Pain   Gastroesophageal Reflux    HPI   Ms. Chandran is seen for chronic follow-up and to address specific items as below.  She does have past history of GERD, hyperlipidemia, chronic normocytic anemia, vitamin D  deficiency, history of B12 deficiency.  She had labs last visit with low vitamin D  of 15.  We started vitamin D  50,000 international units once weekly.  Her hemoglobin is stable 11.5.  B12 was low normal range.  She is taking over-the-counter multivitamin with B12 daily.  Right shoulder pain in recent weeks.  Somewhat better currently.  Using Tylenol.  No limited range of motion.  No classic radiculitis symptoms.  She does a lot of editing and typing and wonders if some of this may be ergonomic related to her frequent typing.  She is very diligent to maintain range of motion.  She stays very active.  She has some history of osteoarthritis involving several digits of the hands but no significant pain issues.  She states her left index finger sometimes locks .  No trigger finger.  She has increased stiffness at times but no significant functional limitations.  She has not had history of RSV or Shingrix vaccine and is considering both.  She also plans to get COVID and flu vaccine later this fall.  Past Medical History:  Diagnosis Date   CONTUSION, LEG 01/30/2010   SCIATICA, ACUTE 10/05/2008   Past Surgical History:  Procedure Laterality Date   VEIN SURGERY     varicose, right leg    reports that she has never smoked. She has never used smokeless tobacco. She reports current alcohol use. She reports that she does not use drugs. family history includes Leukemia in her son. Allergies  Allergen Reactions   Sulfadiazine     As a child    Review of Systems  Constitutional:  Negative for  malaise/fatigue.  Eyes:  Negative for blurred vision.  Respiratory:  Negative for shortness of breath.   Cardiovascular:  Negative for chest pain.  Neurological:  Negative for dizziness, weakness and headaches.      Objective:     BP 138/68 (BP Location: Left Arm, Cuff Size: Normal)   Pulse 76   Temp 97.8 F (36.6 C) (Oral)   Wt 108 lb 8 oz (49.2 kg)   SpO2 96%   BMI 18.99 kg/m  BP Readings from Last 3 Encounters:  02/25/24 138/68  11/27/23 138/72  11/22/23 (!) 142/70   Wt Readings from Last 3 Encounters:  02/25/24 108 lb 8 oz (49.2 kg)  11/27/23 106 lb 4.8 oz (48.2 kg)  11/22/23 108 lb 11.2 oz (49.3 kg)      Physical Exam Vitals reviewed.  Constitutional:      Appearance: She is well-developed.  Eyes:     Pupils: Pupils are equal, round, and reactive to light.  Neck:     Thyroid : No thyromegaly.     Vascular: No JVD.  Cardiovascular:     Rate and Rhythm: Normal rate and regular rhythm.     Heart sounds:     No gallop.  Pulmonary:     Effort: Pulmonary effort is normal. No respiratory distress.     Breath sounds: Normal breath sounds. No wheezing or rales.  Musculoskeletal:     Cervical back: Neck  supple.     Comments: She has good range of motion right shoulder.  Slightly limited internal rotation right compared to left side.    She has some thickened nodular changes of several digits of the hand consistent with osteoarthritis.  Neurological:     Mental Status: She is alert.      No results found for any visits on 02/25/24.  Last CBC Lab Results  Component Value Date   WBC 5.4 11/22/2023   HGB 11.5 (L) 11/22/2023   HCT 34.9 (L) 11/22/2023   MCV 85.1 11/22/2023   RDW 13.5 11/22/2023   PLT 225.0 11/22/2023   Last metabolic panel Lab Results  Component Value Date   GLUCOSE 88 11/22/2023   NA 142 11/22/2023   K 4.3 11/22/2023   CL 109 11/22/2023   CO2 27 11/22/2023   BUN 17 11/22/2023   CREATININE 0.75 11/22/2023   GFR 71.54 11/22/2023    CALCIUM 9.3 11/22/2023   PROT 6.5 09/12/2020   ALBUMIN 4.2 09/12/2020   BILITOT 0.8 09/12/2020   ALKPHOS 75 09/12/2020   AST 18 09/12/2020   ALT 13 09/12/2020   Last lipids Lab Results  Component Value Date   CHOL 274 (H) 09/12/2020   HDL 74.50 09/12/2020   LDLCALC 181 (H) 09/12/2020   LDLDIRECT 156.7 07/11/2011   TRIG 91.0 09/12/2020   CHOLHDL 4 09/12/2020   Last thyroid  functions Lab Results  Component Value Date   TSH 0.72 09/13/2021   Last vitamin D  Lab Results  Component Value Date   VD25OH 15.44 (L) 11/22/2023   Last vitamin B12 and Folate Lab Results  Component Value Date   VITAMINB12 240 11/22/2023      The ASCVD Risk score (Arnett DK, et al., 2019) failed to calculate for the following reasons:   The 2019 ASCVD risk score is only valid for ages 43 to 11    Assessment & Plan:   #1 right shoulder pain.  Suspect rotator cuff tendinitis and/or bursitis.  No evidence for adhesive capsulitis.  Maintain range of motion.  Symptoms are somewhat better compared to last visit.  Continue Tylenol as needed.  #2 health maintenance.  Discussed several immunizations.  She plans to get COVID and flu vaccine probably simultaneously this fall.  We have also suggested RSV vaccine given her age and also recommend Shingrix  #3 osteoarthritis involving both hands.  Patient has no significant pain issues.  No significant limited range of motion.  #4 low vitamin D .  Recheck 25-hydroxy vitamin D   #5 history of low B12.  Patient currently taking over-the-counter B12.  Last labs revealed B12 level 240  #6 history of chronic normocytic anemia which has been stable for years.   No follow-ups on file.    Wolm Scarlet, MD

## 2024-02-26 ENCOUNTER — Ambulatory Visit: Payer: Self-pay | Admitting: Family Medicine

## 2024-02-26 LAB — VITAMIN D 25 HYDROXY (VIT D DEFICIENCY, FRACTURES): VITD: 56.12 ng/mL (ref 30.00–100.00)

## 2024-03-17 ENCOUNTER — Encounter: Payer: Self-pay | Admitting: Family Medicine

## 2024-03-20 ENCOUNTER — Ambulatory Visit (INDEPENDENT_AMBULATORY_CARE_PROVIDER_SITE_OTHER)

## 2024-03-20 ENCOUNTER — Ambulatory Visit (INDEPENDENT_AMBULATORY_CARE_PROVIDER_SITE_OTHER): Admitting: Family Medicine

## 2024-03-20 VITALS — BP 120/68 | HR 75 | Temp 97.5°F | Ht 63.39 in | Wt 107.5 lb

## 2024-03-20 DIAGNOSIS — M25511 Pain in right shoulder: Secondary | ICD-10-CM

## 2024-03-20 DIAGNOSIS — R079 Chest pain, unspecified: Secondary | ICD-10-CM | POA: Diagnosis not present

## 2024-03-20 NOTE — Progress Notes (Signed)
 Established Patient Office Visit  Subjective   Patient ID: Christina Oliver, female    DOB: 05/03/1934  Age: 89 y.o. MRN: [id number]  Chief Complaint  Patient presents with   Chest Pain    Pt c/o chest pain for about wk. Radiates around her L side breast. Work out in the yard for 7 hours yesterday. Notice sx when lay down. Pt states it Could be a bruise.     HPI   Christina Oliver is a 89 year old female who is very health-conscious who had sent a note recently about some recent right upper extremity pain.  Somewhat poorly localized around the right shoulder region.  She states this is substantially better and pain almost resolved.  Denies any injury.  She stays very active and in fact worked 7 hours yesterday outside without difficulty.  She also relates episode about a week and a half ago of some soreness to touch left chest region.  Did not see any bruising.  Does not recall any injury.  No chest tightness.  No exertional symptoms.  No pleuritic pain.  Occasional cough.  No dyspnea or palpitations.  No visible rashes.  Never smoked.  Appetite and weight stable.  Past Medical History:  Diagnosis Date   CONTUSION, LEG 01/30/2010   SCIATICA, ACUTE 10/05/2008   Past Surgical History:  Procedure Laterality Date   VEIN SURGERY     varicose, right leg    reports that she has never smoked. She has never used smokeless tobacco. She reports current alcohol use. She reports that she does not use drugs. family history includes Leukemia in her son. Allergies  Allergen Reactions   Sulfadiazine     As a child    Review of Systems  Constitutional:  Negative for fever and malaise/fatigue.  Eyes:  Negative for blurred vision.  Respiratory:  Negative for hemoptysis and shortness of breath.   Cardiovascular:  Positive for chest pain. Negative for orthopnea, claudication, leg swelling and PND.  Neurological:  Negative for dizziness, weakness and headaches.      Objective:     BP 120/68  (BP Location: Left Arm, Patient Position: Sitting, Cuff Size: Normal)   Pulse 75   Temp (!) 97.5 F (36.4 C) (Oral)   Ht 5' 3.39 (1.61 m)   Wt 107 lb 8 oz (48.8 kg)   SpO2 97%   BMI 18.81 kg/m  BP Readings from Last 3 Encounters:  03/20/24 120/68  02/25/24 138/68  11/27/23 138/72   Wt Readings from Last 3 Encounters:  03/20/24 107 lb 8 oz (48.8 kg)  02/25/24 108 lb 8 oz (49.2 kg)  11/27/23 106 lb 4.8 oz (48.2 kg)      Physical Exam Vitals reviewed.  Constitutional:      Appearance: She is well-developed.  Eyes:     Pupils: Pupils are equal, round, and reactive to light.  Neck:     Thyroid : No thyromegaly.     Vascular: No JVD.  Cardiovascular:     Rate and Rhythm: Normal rate and regular rhythm.     Heart sounds:     No gallop.  Pulmonary:     Effort: Pulmonary effort is normal. No respiratory distress.     Breath sounds: No wheezing or rales.     Comments: She had a few faint crackles right upper lobe initially but cleared with deep breathing. Musculoskeletal:     Cervical back: Neck supple.     Comments: Right shoulder mild pain with internal rotation.  Good ROM with abduction.    Neurological:     Mental Status: She is alert.      No results found for any visits on 03/20/24.    The ASCVD Risk score (Arnett DK, et al., 2019) failed to calculate for the following reasons:   The 2019 ASCVD risk score is only valid for ages 47 to 73    Assessment & Plan:   #1 recent left chest wall pain.  Suspect musculoskeletal.   No exertional chest pain.  Denies dypnea, chest pressure, diaphoresis, or other predictors of angina.  Pain has improved somewhat since onset.   No pleuritic pain.  Low clinical suspicion for PE.  She has had some occasional cough.  Obtain PA/lateral CXR to further assess  #2 right shoulder/proximal deltoid pain.   ?subacromial bursitis or tendonitis.  Improving with good range of motion.  Observe for now.     No follow-ups on file.    Wolm Scarlet, MD

## 2024-03-26 ENCOUNTER — Ambulatory Visit: Payer: Self-pay | Admitting: Family Medicine

## 2024-04-29 ENCOUNTER — Ambulatory Visit (INDEPENDENT_AMBULATORY_CARE_PROVIDER_SITE_OTHER): Payer: No Typology Code available for payment source

## 2024-04-29 VITALS — BP 120/60 | HR 60 | Temp 97.7°F | Ht 63.0 in | Wt 111.7 lb

## 2024-04-29 DIAGNOSIS — Z0000 Encounter for general adult medical examination without abnormal findings: Secondary | ICD-10-CM

## 2024-04-29 NOTE — Patient Instructions (Addendum)
 Christina Oliver,  Thank you for taking the time for your Medicare Wellness Visit. I appreciate your continued commitment to your health goals. Please review the care plan we discussed, and feel free to reach out if I can assist you further.  Please note that Annual Wellness Visits do not include a physical exam. Some assessments may be limited, especially if the visit was conducted virtually. If needed, we may recommend an in-person follow-up with your provider.  Ongoing Care Seeing your primary care provider every 3 to 6 months helps us  monitor your health and provide consistent, personalized care.   Referrals If a referral was made during today's visit and you haven't received any updates within two weeks, please contact the referred provider directly to check on the status.  Recommended Screenings:  Health Maintenance  Topic Date Due   Zoster (Shingles) Vaccine (1 of 2) Never done   Flu Shot  01/17/2024   COVID-19 Vaccine (5 - 2025-26 season) 02/17/2024   Medicare Annual Wellness Visit  04/29/2025   DTaP/Tdap/Td vaccine (4 - Td or Tdap) 08/12/2033   Pneumococcal Vaccine for age over 46  Completed   Meningitis B Vaccine  Aged Out   DEXA scan (bone density measurement)  Discontinued       04/29/2024   11:41 AM  Advanced Directives  Does Patient Have a Medical Advance Directive? Yes  Type of Estate Agent of [place];Living will  Does patient want to make changes to medical advance directive? No - Patient declined  Copy of Healthcare Power of Attorney in Chart? No - copy requested    Vision: Annual vision screenings are recommended for early detection of glaucoma, cataracts, and diabetic retinopathy. These exams can also reveal signs of chronic conditions such as diabetes and high blood pressure.  Dental: Annual dental screenings help detect early signs of oral cancer, gum disease, and other conditions linked to overall health, including heart disease and  diabetes.  Please see the attached documents for additional preventive care recommendations.

## 2024-04-29 NOTE — Progress Notes (Signed)
 Chief Complaint  Patient presents with   Medicare Wellness     Subjective:   Christina Oliver is a 89 y.o. female who presents for a Welcome to Medicare Exam.   Allergies (verified) Sulfadiazine   History: Past Medical History:  Diagnosis Date   CONTUSION, LEG 01/30/2010   SCIATICA, ACUTE 10/05/2008   Past Surgical History:  Procedure Laterality Date   VEIN SURGERY     varicose, right leg   Family History  Problem Relation Age of Onset   Leukemia Son    BRCA 1/2 Neg Hx    Breast cancer Neg Hx    Social History   Occupational History   Not on file  Tobacco Use   Smoking status: Never   Smokeless tobacco: Never  Vaping Use   Vaping status: Never Used  Substance and Sexual Activity   Alcohol use: Yes   Drug use: No   Sexual activity: Not on file   Tobacco Counseling Counseling given: No  SDOH Screenings   Food Insecurity: No Food Insecurity (04/29/2024)  Housing: Unknown (04/29/2024)  Transportation Needs: No Transportation Needs (04/29/2024)  Utilities: Not At Risk (04/29/2024)  Alcohol Screen: Low Risk  (11/23/2023)  Depression (PHQ2-9): Low Risk  (04/29/2024)  Financial Resource Strain: Patient Declined (11/23/2023)  Physical Activity: Sufficiently Active (04/29/2024)  Social Connections: Moderately Integrated (04/29/2024)  Stress: No Stress Concern Present (04/29/2024)  Tobacco Use: Low Risk  (04/29/2024)  Health Literacy: Adequate Health Literacy (04/29/2024)   Depression Screen    04/29/2024   11:46 AM 04/24/2023    1:41 PM 03/20/2022    9:32 AM 08/04/2021   10:37 AM 03/07/2021    9:41 AM 03/02/2020   11:25 AM 09/11/2019    1:33 PM  PHQ 2/9 Scores  PHQ - 2 Score 0 0 0 0 0 0 0  PHQ- 9 Score      0       Data saved with a previous flowsheet row definition     Goals Addressed               This Visit's Progress     Increase physical activity (pt-stated)        Remain active.       Visit info / Clinical Intake: Medicare Wellness Visit  Type:: Subsequent Annual Wellness Visit Persons participating in visit:: patient Medicare Wellness Visit Mode:: In-person (required for WTM) Information given by:: patient Interpreter Needed?: No Pre-visit prep was completed: no AWV questionnaire completed by patient prior to visit?: no Living arrangements:: (!) lives alone Patient's Overall Health Status Rating: excellent Typical amount of pain: none Does pain affect daily life?: no Are you currently prescribed opioids?: no  Dietary Habits and Nutritional Risks How many meals a day?: 3 Eats fruit and vegetables daily?: yes Most meals are obtained by: preparing own meals In the last 2 weeks, have you had any of the following?: none Diabetic:: no  Functional Status Activities of Daily Living (to include ambulation/medication): Independent Ambulation: Independent with device- listed below Home Assistive Devices/Equipment: Eyeglasses Medication Administration: Independent Home Management: Independent Manage your own finances?: yes Primary transportation is: driving Concerns about vision?: no *vision screening is required for WTM* Concerns about hearing?: no  Fall Screening Falls in the past year?: 0 Number of falls in past year: 0 Was there an injury with Fall?: 0 Fall Risk Category Calculator: 0 Patient Fall Risk Level: Low Fall Risk  Fall Risk Patient at Risk for Falls Due to: No Fall Risks  Home and Transportation Safety: All rugs have non-skid backing?: yes All stairs or steps have railings?: yes Grab bars in the bathtub or shower?: yes Have non-skid surface in bathtub or shower?: yes Good home lighting?: yes Regular seat belt use?: yes Hospital stays in the last year:: no  Cognitive Assessment Difficulty concentrating, remembering, or making decisions? : no Will 6CIT or Mini Cog be Completed: no 6CIT or Mini Cog Declined: patient alert, oriented, able to answer questions appropriately and recall recent  events  Advance Directives (For Healthcare) Does Patient Have a Medical Advance Directive?: Yes Does patient want to make changes to medical advance directive?: No - Patient declined Type of Advance Directive: Healthcare Power of [place]; Living will Copy of Healthcare Power of Attorney in Chart?: No - copy requested Copy of Living Will in Chart?: No - copy requested  Reviewed/Updated  Reviewed/Updated: Reviewed All (Medical, Surgical, Family, Medications, Allergies, Care Teams, Patient Goals)         Objective:    Today's Vitals   04/29/24 1137  BP: 120/60  Pulse: 60  Temp: 97.7 F (36.5 C)  TempSrc: Oral  SpO2: 96%  Weight: 111 lb 11.2 oz (50.7 kg)  Height: 5' 3 (1.6 m)   Body mass index is 19.79 kg/m.   Physical Exam   Current Medications (verified) Outpatient Encounter Medications as of 04/29/2024  Medication Sig   Cyanocobalamin  (VITAMIN B-12 PO) Take by mouth daily.   ibuprofen (ADVIL) 200 MG tablet Take 200 mg by mouth every 6 (six) hours as needed for mild pain (pain score 1-3) (for R arm pain.).   PFIZER-BIONT COVID-19 VAC-TRIS SUSP injection  (Patient not taking: Reported on 03/20/2024)   Vitamin D , Ergocalciferol , (DRISDOL ) 1.25 MG (50000 UNIT) CAPS capsule Take 1 capsule (50,000 Units total) by mouth every 7 (seven) days.   No facility-administered encounter medications on file as of 04/29/2024.   Hearing/Vision screen Hearing Screening - Comments:: Denies hearing difficulties   Vision Screening - Comments:: Wears reading glasses - up to date with routine eye exams with [facility]  Immunizations and Health Maintenance Health Maintenance  Topic Date Due   Zoster Vaccines- Shingrix (1 of 2) Never done   Influenza Vaccine  01/17/2024   COVID-19 Vaccine (5 - 2025-26 season) 02/17/2024   Medicare Annual Wellness (AWV)  04/29/2025   DTaP/Tdap/Td (4 - Td or Tdap) 08/12/2033   Pneumococcal Vaccine: 50+ Years  Completed   Meningococcal B Vaccine   Aged Out   DEXA SCAN  Discontinued         Assessment/Plan:  This is a routine wellness examination for Christina Oliver.  Patient Care Team: Micheal Wolm ORN, MD as PCP - General  I have personally reviewed and noted the following in the patient's chart:   Medical and social history Use of alcohol, tobacco or illicit drugs  Current medications and supplements including opioid prescriptions. Functional ability and status Nutritional status Physical activity Advanced directives List of other physicians Hospitalizations, surgeries, and ER visits in previous 12 months Vitals Screenings to include cognitive, depression, and falls Referrals and appointments  No orders of the defined types were placed in this encounter.  In addition, I have reviewed and discussed with patient certain preventive protocols, quality metrics, and best practice recommendations. A written personalized care plan for preventive services as well as general preventive health recommendations were provided to patient.   Rojelio ORN Blush, LPN   [id number]    Return in 1 year  on 05/05/25)

## 2024-11-23 ENCOUNTER — Encounter: Admitting: Family Medicine

## 2025-05-05 ENCOUNTER — Ambulatory Visit
